# Patient Record
Sex: Female | Born: 1965 | Race: Black or African American | Hispanic: No | Marital: Married | Smoking: Never smoker
Health system: Southern US, Community
[De-identification: ages and names within clinical notes are randomized; demographics above are authoritative.]

## PROBLEM LIST (undated history)

## (undated) DIAGNOSIS — Q211 Atrial septal defect, unspecified: Secondary | ICD-10-CM

## (undated) DIAGNOSIS — R55 Syncope and collapse: Secondary | ICD-10-CM

## (undated) DIAGNOSIS — R2990 Unspecified symptoms and signs involving the nervous system: Secondary | ICD-10-CM

## (undated) DIAGNOSIS — R87619 Unspecified abnormal cytological findings in specimens from cervix uteri: Secondary | ICD-10-CM

## (undated) DIAGNOSIS — Z8742 Personal history of other diseases of the female genital tract: Secondary | ICD-10-CM

## (undated) DIAGNOSIS — R011 Cardiac murmur, unspecified: Secondary | ICD-10-CM

## (undated) HISTORY — DX: Atrial septal defect: Q21.1

## (undated) HISTORY — DX: Atrial septal defect, unspecified: Q21.10

## (undated) HISTORY — DX: Unspecified abnormal cytological findings in specimens from cervix uteri: R87.619

## (undated) HISTORY — PX: CARDIAC CATHETERIZATION: SHX172

## (undated) HISTORY — PX: US ECHOCARDIOGRAPHY: HXRAD669

## (undated) HISTORY — DX: Unspecified symptoms and signs involving the nervous system: R29.90

## (undated) HISTORY — DX: Personal history of other diseases of the female genital tract: Z87.42

## (undated) HISTORY — DX: Cardiac murmur, unspecified: R01.1

## (undated) HISTORY — DX: Syncope and collapse: R55

## (undated) HISTORY — PX: TUBAL LIGATION: SHX77

---

## 2000-10-16 ENCOUNTER — Other Ambulatory Visit: Admission: RE | Admit: 2000-10-16 | Discharge: 2000-10-16 | Payer: Self-pay | Admitting: Internal Medicine

## 2001-10-18 ENCOUNTER — Other Ambulatory Visit: Admission: RE | Admit: 2001-10-18 | Discharge: 2001-10-18 | Payer: Self-pay | Admitting: Internal Medicine

## 2002-10-22 ENCOUNTER — Other Ambulatory Visit: Admission: RE | Admit: 2002-10-22 | Discharge: 2002-10-22 | Payer: Self-pay | Admitting: Internal Medicine

## 2003-11-07 ENCOUNTER — Other Ambulatory Visit: Admission: RE | Admit: 2003-11-07 | Discharge: 2003-11-07 | Payer: Self-pay | Admitting: Internal Medicine

## 2003-12-17 ENCOUNTER — Encounter: Payer: Self-pay | Admitting: Internal Medicine

## 2003-12-23 ENCOUNTER — Encounter: Payer: Self-pay | Admitting: Cardiology

## 2003-12-23 ENCOUNTER — Ambulatory Visit (HOSPITAL_COMMUNITY): Admission: RE | Admit: 2003-12-23 | Discharge: 2003-12-23 | Payer: Self-pay | Admitting: Cardiology

## 2004-02-15 ENCOUNTER — Emergency Department (HOSPITAL_COMMUNITY): Admission: EM | Admit: 2004-02-15 | Discharge: 2004-02-15 | Payer: Self-pay | Admitting: *Deleted

## 2004-04-20 ENCOUNTER — Emergency Department (HOSPITAL_COMMUNITY): Admission: EM | Admit: 2004-04-20 | Discharge: 2004-04-20 | Payer: Self-pay | Admitting: Emergency Medicine

## 2004-08-19 ENCOUNTER — Encounter: Payer: Self-pay | Admitting: Internal Medicine

## 2006-06-05 ENCOUNTER — Ambulatory Visit: Payer: Self-pay | Admitting: Internal Medicine

## 2006-06-05 ENCOUNTER — Other Ambulatory Visit: Admission: RE | Admit: 2006-06-05 | Discharge: 2006-06-05 | Payer: Self-pay | Admitting: Internal Medicine

## 2006-06-05 ENCOUNTER — Encounter: Payer: Self-pay | Admitting: Internal Medicine

## 2006-06-05 LAB — CONVERTED CEMR LAB
ALT: 14 units/L (ref 0–40)
AST: 20 units/L (ref 0–37)
BUN: 12 mg/dL (ref 6–23)
Basophils Absolute: 0 10*3/uL (ref 0.0–0.1)
Basophils Relative: 0.6 % (ref 0.0–1.0)
Bilirubin, Direct: 0.1 mg/dL (ref 0.0–0.3)
CO2: 27 meq/L (ref 19–32)
Calcium: 8.8 mg/dL (ref 8.4–10.5)
Chloride: 105 meq/L (ref 96–112)
Creatinine, Ser: 0.8 mg/dL (ref 0.4–1.2)
Eosinophil percent: 0.8 % (ref 0.0–5.0)
Glucose, Bld: 88 mg/dL (ref 70–99)
HDL: 44.3 mg/dL (ref >39.0)
Lymphocytes Relative: 24.8 % (ref 12.0–46.0)
MCV: 90.3 fL (ref 78.0–100.0)
Monocytes Absolute: 0.5 10*3/uL (ref 0.2–0.7)
Neutro Abs: 4.7 10*3/uL (ref 1.4–7.7)
Neutrophils Relative %: 67.1 % (ref 43.0–77.0)
Sodium: 138 meq/L (ref 135–145)
WBC: 7 10*3/uL (ref 4.5–10.5)

## 2006-10-02 ENCOUNTER — Ambulatory Visit: Payer: Self-pay | Admitting: Internal Medicine

## 2006-10-02 ENCOUNTER — Encounter: Admission: RE | Admit: 2006-10-02 | Discharge: 2006-10-02 | Payer: Self-pay | Admitting: Internal Medicine

## 2007-01-12 ENCOUNTER — Ambulatory Visit: Payer: Self-pay | Admitting: Internal Medicine

## 2007-04-12 ENCOUNTER — Telehealth: Payer: Self-pay | Admitting: Internal Medicine

## 2007-06-26 ENCOUNTER — Other Ambulatory Visit: Admission: RE | Admit: 2007-06-26 | Discharge: 2007-06-26 | Payer: Self-pay | Admitting: Internal Medicine

## 2007-06-26 ENCOUNTER — Ambulatory Visit: Payer: Self-pay | Admitting: Internal Medicine

## 2007-06-26 ENCOUNTER — Encounter: Payer: Self-pay | Admitting: Internal Medicine

## 2007-06-26 DIAGNOSIS — Q211 Atrial septal defect: Secondary | ICD-10-CM | POA: Insufficient documentation

## 2007-07-02 LAB — CONVERTED CEMR LAB
AST: 16 units/L (ref 0–37)
Albumin: 3.6 g/dL (ref 3.5–5.2)
Alkaline Phosphatase: 44 units/L (ref 39–117)
Basophils Relative: 0.7 % (ref 0.0–1.0)
Chloride: 106 meq/L (ref 96–112)
Eosinophils Absolute: 0.1 10*3/uL (ref 0.0–0.6)
GFR calc non Af Amer: 66 mL/min
Glucose, Bld: 75 mg/dL (ref 70–99)
HCT: 36.6 % (ref 36.0–46.0)
LDL Cholesterol: 126 mg/dL — ABNORMAL HIGH (ref 0–99)
Lymphocytes Relative: 27.5 % (ref 12.0–46.0)
MCV: 89.8 fL (ref 78.0–100.0)
Neutro Abs: 4 10*3/uL (ref 1.4–7.7)
Platelets: 203 10*3/uL (ref 150–400)
Potassium: 4.3 meq/L (ref 3.5–5.1)
RDW: 13.3 % (ref 11.5–14.6)
Total Protein: 7 g/dL (ref 6.0–8.3)
VLDL: 9 mg/dL (ref 0–40)

## 2007-07-06 ENCOUNTER — Ambulatory Visit: Payer: Self-pay

## 2007-07-06 ENCOUNTER — Encounter: Payer: Self-pay | Admitting: Internal Medicine

## 2007-07-12 ENCOUNTER — Telehealth: Payer: Self-pay | Admitting: Internal Medicine

## 2007-09-03 ENCOUNTER — Telehealth: Payer: Self-pay | Admitting: Internal Medicine

## 2007-09-20 ENCOUNTER — Encounter: Payer: Self-pay | Admitting: Internal Medicine

## 2007-09-25 ENCOUNTER — Encounter: Payer: Self-pay | Admitting: Internal Medicine

## 2007-09-26 ENCOUNTER — Encounter: Payer: Self-pay | Admitting: Internal Medicine

## 2007-10-03 ENCOUNTER — Ambulatory Visit: Payer: Self-pay | Admitting: Internal Medicine

## 2007-10-03 DIAGNOSIS — R05 Cough: Secondary | ICD-10-CM

## 2007-10-03 DIAGNOSIS — R059 Cough, unspecified: Secondary | ICD-10-CM | POA: Insufficient documentation

## 2007-10-04 ENCOUNTER — Encounter: Payer: Self-pay | Admitting: Internal Medicine

## 2007-12-31 ENCOUNTER — Ambulatory Visit: Payer: Self-pay | Admitting: Internal Medicine

## 2007-12-31 DIAGNOSIS — T7840XA Allergy, unspecified, initial encounter: Secondary | ICD-10-CM | POA: Insufficient documentation

## 2008-01-27 ENCOUNTER — Encounter: Payer: Self-pay | Admitting: Internal Medicine

## 2008-01-28 ENCOUNTER — Telehealth: Payer: Self-pay | Admitting: Internal Medicine

## 2008-01-28 ENCOUNTER — Ambulatory Visit: Payer: Self-pay | Admitting: Internal Medicine

## 2008-01-28 DIAGNOSIS — R079 Chest pain, unspecified: Secondary | ICD-10-CM | POA: Insufficient documentation

## 2008-01-28 DIAGNOSIS — R5383 Other fatigue: Secondary | ICD-10-CM | POA: Insufficient documentation

## 2008-01-28 DIAGNOSIS — R5381 Other malaise: Secondary | ICD-10-CM | POA: Insufficient documentation

## 2008-01-30 ENCOUNTER — Encounter: Payer: Self-pay | Admitting: Internal Medicine

## 2008-02-05 ENCOUNTER — Encounter: Payer: Self-pay | Admitting: Internal Medicine

## 2008-07-01 ENCOUNTER — Ambulatory Visit: Payer: Self-pay | Admitting: Internal Medicine

## 2008-07-01 ENCOUNTER — Other Ambulatory Visit: Admission: RE | Admit: 2008-07-01 | Discharge: 2008-07-01 | Payer: Self-pay | Admitting: Internal Medicine

## 2008-07-01 ENCOUNTER — Encounter: Payer: Self-pay | Admitting: Internal Medicine

## 2008-07-01 DIAGNOSIS — M949 Disorder of cartilage, unspecified: Secondary | ICD-10-CM

## 2008-07-01 DIAGNOSIS — M899 Disorder of bone, unspecified: Secondary | ICD-10-CM | POA: Insufficient documentation

## 2008-07-01 LAB — CONVERTED CEMR LAB
Bilirubin Urine: NEGATIVE
Nitrite: POSITIVE
Vit D, 1,25-Dihydroxy: 23 — ABNORMAL LOW (ref 30–89)
WBC Urine, dipstick: NEGATIVE
pH: 5

## 2008-07-03 LAB — CONVERTED CEMR LAB
ALT: 15 units/L (ref 0–35)
Albumin: 4.1 g/dL (ref 3.5–5.2)
Basophils Absolute: 0 10*3/uL (ref 0.0–0.1)
Bilirubin, Direct: 0.3 mg/dL (ref 0.0–0.3)
Chloride: 105 meq/L (ref 96–112)
Cholesterol: 184 mg/dL (ref 0–200)
Creatinine, Ser: 0.7 mg/dL (ref 0.4–1.2)
Eosinophils Relative: 1 % (ref 0.0–5.0)
Glucose, Bld: 76 mg/dL (ref 70–99)
HCT: 40.6 % (ref 36.0–46.0)
HDL: 52.6 mg/dL (ref >39.0)
Hemoglobin: 13.9 g/dL (ref 12.0–15.0)
Lymphocytes Relative: 24 % (ref 12.0–46.0)
Platelets: 174 10*3/uL (ref 150–400)
RBC: 4.39 M/uL (ref 3.87–5.11)
RDW: 13.2 % (ref 11.5–14.6)
Sodium: 138 meq/L (ref 135–145)
TSH: 1.84 microintl units/mL (ref 0.35–5.50)
Total Bilirubin: 1.3 mg/dL — ABNORMAL HIGH (ref 0.3–1.2)
WBC: 7.2 10*3/uL (ref 4.5–10.5)

## 2008-08-01 DIAGNOSIS — I639 Cerebral infarction, unspecified: Secondary | ICD-10-CM

## 2008-08-01 HISTORY — DX: Cerebral infarction, unspecified: I63.9

## 2008-08-14 ENCOUNTER — Encounter: Payer: Self-pay | Admitting: Internal Medicine

## 2008-11-10 ENCOUNTER — Ambulatory Visit: Payer: Self-pay | Admitting: Internal Medicine

## 2008-11-11 ENCOUNTER — Ambulatory Visit: Payer: Self-pay | Admitting: Vascular Surgery

## 2008-11-11 ENCOUNTER — Encounter: Payer: Self-pay | Admitting: Internal Medicine

## 2008-11-11 ENCOUNTER — Observation Stay (HOSPITAL_COMMUNITY): Admission: EM | Admit: 2008-11-11 | Discharge: 2008-11-11 | Payer: Self-pay | Admitting: Emergency Medicine

## 2008-11-12 ENCOUNTER — Ambulatory Visit: Payer: Self-pay | Admitting: Internal Medicine

## 2009-01-22 ENCOUNTER — Encounter: Payer: Self-pay | Admitting: Internal Medicine

## 2009-03-04 ENCOUNTER — Telehealth: Payer: Self-pay | Admitting: *Deleted

## 2009-07-07 ENCOUNTER — Encounter (INDEPENDENT_AMBULATORY_CARE_PROVIDER_SITE_OTHER): Payer: Self-pay | Admitting: *Deleted

## 2009-07-09 ENCOUNTER — Telehealth: Payer: Self-pay | Admitting: *Deleted

## 2009-07-15 ENCOUNTER — Other Ambulatory Visit: Admission: RE | Admit: 2009-07-15 | Discharge: 2009-07-15 | Payer: Self-pay | Admitting: Internal Medicine

## 2009-07-15 ENCOUNTER — Ambulatory Visit: Payer: Self-pay | Admitting: Internal Medicine

## 2010-03-12 ENCOUNTER — Telehealth: Payer: Self-pay | Admitting: *Deleted

## 2010-05-26 ENCOUNTER — Telehealth: Payer: Self-pay | Admitting: *Deleted

## 2010-05-27 ENCOUNTER — Encounter: Payer: Self-pay | Admitting: Internal Medicine

## 2010-06-28 ENCOUNTER — Ambulatory Visit: Payer: Self-pay

## 2010-07-02 ENCOUNTER — Encounter: Payer: Self-pay | Admitting: Internal Medicine

## 2010-07-08 ENCOUNTER — Encounter: Payer: Self-pay | Admitting: Internal Medicine

## 2010-07-08 ENCOUNTER — Ambulatory Visit: Payer: Self-pay

## 2010-07-20 ENCOUNTER — Telehealth: Payer: Self-pay | Admitting: Internal Medicine

## 2010-07-23 ENCOUNTER — Other Ambulatory Visit
Admission: RE | Admit: 2010-07-23 | Discharge: 2010-07-23 | Payer: Self-pay | Source: Home / Self Care | Admitting: Family Medicine

## 2010-07-28 ENCOUNTER — Ambulatory Visit: Payer: Self-pay | Admitting: Internal Medicine

## 2010-07-28 ENCOUNTER — Telehealth: Payer: Self-pay

## 2010-07-29 ENCOUNTER — Telehealth: Payer: Self-pay

## 2010-08-29 LAB — CONVERTED CEMR LAB
Bilirubin Urine: NEGATIVE
Glucose, Urine, Semiquant: NEGATIVE
Protein, U semiquant: NEGATIVE

## 2010-08-31 NOTE — Progress Notes (Signed)
Summary: wants order faxed  Phone Note Call from Patient Call back at Work Phone [phone]   Caller: Patient---live call Summary of Call: need an order for a screening mammogram sent to [facility]. she has not made her appt yet. wants to go before her December cpx. Initial call taken by: Warnell Forester,  May 26, 2010 9:22 AM  Follow-up for Phone Call        Left message on machine saying that pt doesn't need a order for a screening mmg. I told her to call [facility] and just schedule the appt. Then if they tell her that she needs a order, we would be happy to do one. Just call us back to let us know. Follow-up by: Romualdo Bolk, CMA Duncan Dull),  May 26, 2010 9:29 AM

## 2010-08-31 NOTE — Progress Notes (Signed)
Summary: Pt sch cpx for Dec 2011. Req to get labs done at [facility]  Phone Note Call from Patient Call back at [phone] cell   Caller: Patient Summary of Call: Pt called and has sch her cpx for Dec. 2011. Pt is req to get her labs done at [facility] again this year. Pls do order for cpx labs, and pt will pick up when ready.  Initial call taken by: Lucy Antigua,  March 12, 2010 9:15 AM  Follow-up for Phone Call        ok to do this Follow-up by: Madelin Headings MD,  March 15, 2010 9:57 PM  Additional Follow-up for Phone Call Additional follow up Details #1::        Rx is ready to pick up. Pt aware. Additional Follow-up by: Romualdo Bolk, CMA (AAMA),  March 17, 2010 12:12 PM

## 2010-09-02 NOTE — Progress Notes (Signed)
----   Converted from flag ---- ---- 07/29/2010 7:45 AM, Edwyna Perfect MD wrote: pap neg/nl ------------------------------  pt aware

## 2010-09-02 NOTE — Progress Notes (Signed)
----   Converted from flag ---- ---- 07/28/2010 9:42 AM, Edwyna Perfect MD wrote: pls notify physical labs nl ------------------------------  pt aware

## 2010-09-02 NOTE — Progress Notes (Signed)
Summary: REQUEST FOR APPT / RETURN CALL?  Phone Note Call from Patient   Caller: Patient  (In office) Summary of Call: Pt was on bump list, had appt scheduled today (12/20) and took today off work for that appt..... didn't know that she wasn't going to be able to be seen today.Marland KitchenMarland KitchenMarland KitchenPt adv that she had spoke with Carollee Herter, CMA and thought she was going to call back to advise but adv no one called.... would like to be worked into schedule for cpx by the end of year...   Can you advise?  Pt can be reached at work # [phone] to advise.  Pt may c/b to advise when she will be able to take another day off work to come in for cpx but would like a return call regardless.   Initial call taken by: Debbra Riding,  July 20, 2010 9:45 AM  Follow-up for Phone Call        Pt called back and stated that she had to work something out with her schedule due to people at work taking time off.... Went through schedule as she e-mailed her supervisor to find a day that worked and pt adv she would have to have cpx done this Friday, 12/23 (only time pt could take time off before end of year, and/or only time she could come in late for appt).... Pt was scheduled with Dr Rodena Medin for Friday, 07/23/10  at  8:15am.... Pt adv she will switch to Dr Rodena Medin if necessary to keep this appt - has to have cpx before end of year.  Follow-up by: Debbra Riding,  July 20, 2010 10:47 AM  Additional Follow-up for Phone Call Additional follow up Details #1::        Pt got bumped because of EMR  training.Please make That she is  aware that I had nothing to do with the change and this was made by administration.  I would work her in any time im here but will not be here   December   23rd . BUt I will be here next week.  I f  ok with Dr Rodena Medin and he  can see her it is ok with me. Additional Follow-up by: Madelin Headings MD,  July 20, 2010 5:33 PM    Additional Follow-up for Phone Call Additional follow up Details #2::     ok Follow-up by: Edwyna Perfect MD,  July 21, 2010 8:06 AM

## 2010-09-02 NOTE — Assessment & Plan Note (Signed)
Summary: CPX // RS   Vital Signs:  Patient profile:   45 year old female Menstrual status:  regular Height:      64.5 inches Weight:      134.5 pounds BMI:     22.81 Temp:     98.2 degrees F Pulse rate:   68 / minute BP sitting:   110 / 60  (right arm) CC: CPX   CC:  CPX.  History of Present Illness: Pt presents to clinic for annual physical. Has h/o ASD without repair previously followed by Cardiology. Not interested in further cardiology or echocardiogram surveillance and she indicates she will continue to decline surgical intervention. States is asx without dyspnea, chest pain, palpitations or other cardiac symptoms.  Declines influenza vaccine. No active complaints.  Preventive Screening-Counseling & Management  Alcohol-Tobacco     Smoking Status: never     Smoking Cessation Counseling: no     Tobacco Counseling: not indicated; no tobacco use  Problems Prior to Update: 1)  Hx of Weakness, Left Side of Bodyresolved  (ICD-342.90) 2)  Disorder of Bone and Cartilage Unspecified  (ICD-733.90) 3)  Fatigue  (ICD-780.79) 4)  Chest Pain  (ICD-786.50) 5)  Allergic Reaction  (ICD-995.3) 6)  Cough  (ICD-786.2) 7)  Routine Gynecological Examination  (ICD-V72.31) 8)  Atrial Septal Defect  (ICD-745.5) 9)  Preventive Health Care  (ICD-V70.0) 10)  Family History Breast Cancer 1st Degree Relative <50  (ICD-V16.3)  Allergies: No Known Drug Allergies  Family History: Reviewed history from 11/12/2008 and no changes required. Family History Breast cancer 1st degree relative <50 mom  Family History of Cardiovascular disorder bro died mva age 36 Step sister " Hole in heart "since young no one at home with a rash      Social History: Reviewed history from 07/15/2009 and no changes required. Occupation: works at [employer]   40 hours week Married  husband currently unemployed  Never Smoked Alcohol use-no Drug use-no Regular exercise-no   hh of [address].  No pets    church on  saturday doesnt celebrate holidays  Review of Systems General:  Denies chills, fatigue, fever, and sweats; +hot flashes. Eyes:  Denies blurring and vision loss-both eyes. CV:  Denies chest pain or discomfort, difficulty breathing at night, difficulty breathing while lying down, fainting, fatigue, lightheadness, near fainting, palpitations, shortness of breath with exertion, and swelling of feet. Resp:  Denies chest discomfort, chest pain with inspiration, cough, coughing up blood, shortness of breath, and sputum productive. GI:  Denies abdominal pain, bloody stools, change in bowel habits, nausea, and vomiting. Derm:  Denies changes in color of skin, lesion(s), and rash. Neuro:  Denies headaches, inability to speak, memory loss, tingling, visual disturbances, and weakness. Heme:  Denies abnormal bruising and fevers.  Physical Exam  General:  Well-developed,well-nourished,in no acute distress; alert,appropriate and cooperative throughout examination Head:  Normocephalic and atraumatic without obvious abnormalities. No apparent alopecia or balding. Eyes:  pupils equal, pupils round, pupils reactive to light, corneas and lenses clear, and no injection.   Ears:  External ear exam shows no significant lesions or deformities.  Otoscopic examination reveals clear canals, tympanic membranes are intact bilaterally without bulging, retraction, inflammation or discharge. Hearing is grossly normal bilaterally. Nose:  External nasal examination shows no deformity or inflammation. Nasal mucosa are pink and moist without lesions or exudates. Mouth:  Oral mucosa and oropharynx without lesions or exudates.  Teeth in good repair. Neck:  No deformities, masses, or tenderness noted. Breasts:  No mass, nodules, thickening, tenderness, bulging, retraction, inflamation, nipple discharge or skin changes noted.  With female nurse escort exam performed Lungs:  Normal respiratory effort, chest expands symmetrically.  Lungs are clear to auscultation, no crackles or wheezes. Heart:  normal rate, regular rhythm, no gallop, no rub, no JVD, and Grade   3-4/6 systolic ejection murmur.   Abdomen:  Bowel sounds positive,abdomen soft and non-tender without masses, organomegaly or hernias noted. Genitalia:  Pelvic Exam:        External: normal female genitalia without lesions or masses        Vagina: normal without lesions or masses        Cervix: normal without lesions or masses        Adnexa: normal bimanual exam without masses or fullness        Uterus: normal by palpation        Pap smear: performed Exam performed with female nurse escort Pulses:  R and L carotid,radial,femoral,dorsalis pedis and posterior tibial pulses are full and equal bilaterally Extremities:  No clubbing, cyanosis, edema, or deformity noted with normal full range of motion of all joints.     Impression & Recommendations:  Problem # 1:  Preventive Health Care (ICD-V70.0) Assessment Unchanged Followup screening labs recently obtained. Recommend yearly mammography. Notify pap results. Declines influenza vaccine. Followup 1 year cpe or sooner if needed.  Problem # 2:  ATRIAL SEPTAL DEFECT (ICD-745.5) Assessment: Unchanged Asx. Declines further evaluation.  Patient Instructions: 1)  Please schedule a follow-up appointment in 1 year for your physical.   Orders Added: 1)  Est. Patient 40-64 years [phone]

## 2010-11-10 LAB — CK TOTAL AND CKMB (NOT AT ARMC)
CK, MB: 0.3 ng/mL (ref 0.3–4.0)
Relative Index: INVALID (ref 0.0–2.5)

## 2010-11-10 LAB — POCT CARDIAC MARKERS
CKMB, poc: <1 ng/mL — ABNORMAL LOW (ref 1.0–8.0)
Troponin i, poc: <0.05 ng/mL (ref 0.00–0.09)

## 2010-11-10 LAB — LIPID PANEL
HDL: 40 mg/dL (ref >39)
LDL Cholesterol: 118 mg/dL — ABNORMAL HIGH (ref 0–99)
Total CHOL/HDL Ratio: 4.2 RATIO
Triglycerides: 56 mg/dL (ref <150)
VLDL: 11 mg/dL (ref 0–40)

## 2010-11-10 LAB — COMPREHENSIVE METABOLIC PANEL
ALT: 12 U/L (ref 0–35)
AST: 21 U/L (ref 0–37)
Albumin: 3.4 g/dL — ABNORMAL LOW (ref 3.5–5.2)
Albumin: 3.5 g/dL (ref 3.5–5.2)
Alkaline Phosphatase: 44 U/L (ref 39–117)
BUN: 7 mg/dL (ref 6–23)
Calcium: 8.5 mg/dL (ref 8.4–10.5)
Chloride: 108 mEq/L (ref 96–112)
Chloride: 108 mEq/L (ref 96–112)
Creatinine, Ser: 0.8 mg/dL (ref 0.4–1.2)
GFR calc Af Amer: >60 mL/min (ref >60)
Glucose, Bld: 84 mg/dL (ref 70–99)
Potassium: 3.7 mEq/L (ref 3.5–5.1)
Sodium: 138 mEq/L (ref 135–145)
Total Bilirubin: 1 mg/dL (ref 0.3–1.2)
Total Protein: 7.3 g/dL (ref 6.0–8.3)
Total Protein: 7.3 g/dL (ref 6.0–8.3)

## 2010-11-10 LAB — PREGNANCY, URINE: Preg Test, Ur: NEGATIVE

## 2010-11-10 LAB — URINALYSIS, ROUTINE W REFLEX MICROSCOPIC
Bilirubin Urine: NEGATIVE
Hgb urine dipstick: NEGATIVE
Nitrite: NEGATIVE
Protein, ur: NEGATIVE mg/dL
Specific Gravity, Urine: 1.014 (ref 1.005–1.030)
pH: 5.5 (ref 5.0–8.0)

## 2010-11-10 LAB — CBC
HCT: 36.6 % (ref 36.0–46.0)
Platelets: 169 10*3/uL (ref 150–400)
WBC: 7.5 10*3/uL (ref 4.0–10.5)

## 2010-11-10 LAB — TROPONIN I: Troponin I: <0.01 ng/mL (ref 0.00–0.06)

## 2010-12-14 NOTE — H&P (Signed)
NAMEMEGHEN, AKOPYAN      ACCOUNT NO.:  [id number]   MEDICAL RECORD NO.:  [id number]          PATIENT TYPE:  INP   LOCATION:  3731                         FACILITY:  [facility]   PHYSICIAN:  Michiel Cowboy, MDDATE OF BIRTH:  1966-06-15   DATE OF ADMISSION:  11/10/2008  DATE OF DISCHARGE:                              HISTORY & PHYSICAL   PRIMARY CARE Khyler Eschmann:  Neta Mends. Panosh, M.D. with [facility].   CHIEF COMPLAINT:  Nausea, left-sided weakness and possible chest pain.   HISTORY OF THE PRESENT ILLNESS:  The patient is a 45 year old female who  has a history of atrioseptal defect followed by Dr. Anner Crete with  cardiology and who presents with possible chest pain and left-sided  weakness.  The patient came home at around 10 P.M. from work and was  trying to fix herself dinner when she noticed she was having some left-  sided arm and leg weakness.  She also may have had at the same time the  sensation of presyncope, lightheadedness and diaphoresis, and she thinks  maybe chest pain as well, although later she denied this.  These  symptoms lasted for about an hour and she called EMS, and she was  brought into the emergency department where a CT scan of her head was  negative.  The patient was given aspirin and Zofran, and [facility] was  called for admission.  At this time all her symptoms had completely  resolved.   PAST MEDICAL HISTORY:  The past medical history is significant for  atrioseptal defect, otherwise negative.   SOCIAL HISTORY:  The patient denies smoking.  She does not drink or use  drugs.  She lives with her spouse.   FAMILY HISTORY:  The family history is noncontributory.   ALLERGIES:  No known drug allergies.   MEDICATIONS:  The patient takes no medications.   PHYSICAL EXAMINATION:  VITAL SIGNS:  Temperature 98.0, blood pressure  121/90, pulse 87, respirations 18, and she is satting at 100% on room  air.  GENERAL APPEARANCE:  The patient appears to be in no  acute distress.  HEENT:  Head is atraumatic. Moist mucous membranes.  LUNGS:  The lungs are clear to auscultation bilaterally.  HEART:  The heart has a regular rate and rhythm with no murmurs  appreciated.  ABDOMEN:  The abdomen is soft, nontender and nondistended, and slightly  obese.  EXTREMITIES:  The lower extremities are without clubbing, cyanosis or  edema.  NEUROLOGIC EXAMINATION:  Neurologically her strength is 5/5 in all four  extremities.  Cranial nerves II-XII are intact. Neurologically she  appears to be intact.  SKIN:  There are some excoriations on the back, but otherwise intact.   LABORATORY DATA:  Sodium 138, potassium 3.7 and creatinine 0.8.  Otherwise LFTs were within normal limits.  Cardiac markers were  negative.  Negative UA.  Negative pregnancy test.  Computerized  tomographic of the head was unremarkable.  EKG showed normal sinus  rhythm, heart rate of 80 with first degree A-V block, and a PR interval  of 224.   ASSESSMENT AND PLAN:  This is a 45 year old female with a past history  of atrioseptal defect who presents with transient neurological symptoms  and possible chest pain.  1. Neurological symptoms.  Transient ischemic attack cannot be      excluded; however, especially with an atrioseptal defect put her at      a slightly high risk for coronary symptoms, currently resolved.  We      will obtain an magnetic resonance imaging and magnetic resonance      angiography with a two-dimensional echocardiogram, and carotid      Dopplers.  We will further risk stratify her with a fasting lipid      profile, hemoglobin A-1-C and homocystine level.  2. Chest pain.  The patient is unsure if she had this, but we will      cycle cardiac markers and obtain serial electrocardiograms.  3. Blood pressure per the patient; this is stable for her as this is      what her usual blood pressure runs.  We will monitor.  Given her      presyncope we will check orthostatics and  give her gentle      intravenous fluids.  4. Prophylaxes.  Protonix plus Lovenox.      Michiel Cowboy, MD  Electronically Signed     AVD/MEDQ  D:  11/11/2008  T:  11/11/2008  Job:  [id number]   cc:   Neta Mends. Fabian Sharp, MD

## 2010-12-14 NOTE — Discharge Summary (Signed)
Beth Romero, Beth Romero      ACCOUNT NO.:  [id number]   MEDICAL RECORD NO.:  [id number]          PATIENT TYPE:  INP   LOCATION:  3731                         FACILITY:  [facility]   PHYSICIAN:  Beth Romero, MDDATE OF BIRTH:  Jun 08, 1966   DATE OF ADMISSION:  11/10/2008  DATE OF DISCHARGE:  11/11/2008                               DISCHARGE SUMMARY   DISCHARGE DIAGNOSES:  1. Transient left-sided weakness, questions transient ischemic attack,      negative neurology workup this hospitalization.  See details below.  2. Transient chest pain.  No evidence of acute cardiac abnormality.  3. Mild asymptomatic hypotension, suspect baseline.  Discharge blood      pressure 101/72, asymptomatic.  4. History of atrial septal defect, outpatient followup with Dr. Anner Romero      with Cardiology.   Discharge medications recommended are aspirin 81 mg every other day.  The patient was not on any other prescription or over-the-counter or  herbal medications prior to admission.   DISPOSITION:  The patient is discharged home in a medically stable and  improved condition.  She is asymptomatic and has no further complaints  of anything at this time.  Her family is present at the bedside at the  time of discharge.  Followup will be with her primary care physician,  Dr. Berniece Romero.  The patient instructed to call in the next 2 weeks  for followup on echocardiogram ultrasound results as well as general  overall health status.   HOSPITAL COURSE BY PROBLEM:  Transient left-sided weakness.  The patient  is a 45 year old woman in overall good health with history of atrial  septal defect of her heart, which has been asymptomatic and came to the  emergency room on the evening of admission complaining of some left-  sided arm and leg weakness while she was trying to fix herself dinner  after work.  She denied any other associated symptoms and reports that  she was merely feeling fatigued and tired, but  because of concerns of  symptoms lasting for approximately 1 hour, she called EMS and was  brought to the emergency room for further Neuro evaluation.  Initial  head CT was negative, but she was admitted for evaluation of possible  TIA given the resolution of her symptoms by the time she arrived to the  emergency room.  She was placed on telemetry.  She underwent an MRI of  her brain, which showed no acute abnormalities including no  atherosclerotic disease of the large and moderate vessels within her  brain.  Bilateral carotid Dopplers were performed that showed no  significant ICA stenosis and a 2D echo of her heart was performed, the  results are pending at time of this dictation.  The patient has been  asymptomatic throughout this hospitalization.  No arrhythmias noted.  Cardiac enzymes negative x2 and no neuro deficits recurring.  I have  discussed with the patient the results of her finding and recommend the  patient for antiplatelet therapy to continue after discharge, which she  seemed somewhat disincline to do, but said she will consider.  She  reports again that she believes her symptoms  were due to fatigue and  plans to adjust her personal schedule and obligations to reduce the  amount of fatigue causing events in her life.  So, the patient again is  evasive on the details of these plans.  Her family is present at bedside  and all are in agreement with the patient's discharge home.  I have  encouraged her to follow up with her primary care physician, Dr. Berniece Romero in the next 2 weeks or sooner for results of her ultrasound to be  reviewed as well as follow up on her overall medical health status.  Fasting lipids were checked during this hospitalization and  within normal limits.  LDL slightly high at 118, but not requiring  further treatment.  Hemoglobin A1c is 5.7.  Normal CBC, Coags, CMET, and  stable hemodynamics including normal O2 sats at 99% on room air.  The  patient  is felt stable for discharge home at this time.      Beth A. Felicity Coyer, MD  Electronically Signed     VAL/MEDQ  D:  11/11/2008  T:  11/12/2008  Job:  [id number]

## 2011-05-10 ENCOUNTER — Telehealth: Payer: Self-pay | Admitting: Internal Medicine

## 2011-05-10 NOTE — Telephone Encounter (Signed)
Pt needs cpx lab order mail to her. Pt is sch for cpx 12-5.

## 2011-05-11 NOTE — Telephone Encounter (Signed)
Rx mailed to pt

## 2011-07-05 ENCOUNTER — Encounter: Payer: Self-pay | Admitting: Internal Medicine

## 2011-07-06 ENCOUNTER — Ambulatory Visit (INDEPENDENT_AMBULATORY_CARE_PROVIDER_SITE_OTHER): Payer: 59 | Admitting: Internal Medicine

## 2011-07-06 ENCOUNTER — Encounter: Payer: Self-pay | Admitting: Internal Medicine

## 2011-07-06 VITALS — BP 100/60 | HR 72 | Ht 64.5 in | Wt 127.0 lb

## 2011-07-06 DIAGNOSIS — Z0000 Encounter for general adult medical examination without abnormal findings: Secondary | ICD-10-CM

## 2011-07-06 DIAGNOSIS — N951 Menopausal and female climacteric states: Secondary | ICD-10-CM

## 2011-07-06 DIAGNOSIS — R8290 Unspecified abnormal findings in urine: Secondary | ICD-10-CM | POA: Insufficient documentation

## 2011-07-06 DIAGNOSIS — R82998 Other abnormal findings in urine: Secondary | ICD-10-CM

## 2011-07-06 DIAGNOSIS — Q211 Atrial septal defect: Secondary | ICD-10-CM

## 2011-07-06 LAB — POCT URINALYSIS DIPSTICK
Protein, UA: NEGATIVE
Spec Grav, UA: 1.025
Urobilinogen, UA: 1
pH, UA: 5.5

## 2011-07-06 LAB — HEPATIC FUNCTION PANEL
Alkaline Phosphatase: 64 U/L (ref 39–117)
Bilirubin, Direct: 0.1 mg/dL (ref 0.0–0.3)
Total Bilirubin: 0.6 mg/dL (ref 0.3–1.2)

## 2011-07-06 NOTE — Patient Instructions (Addendum)
Your symptom sound like menopause but I am worried about your loss of appetite. Liver tests an calcium is slightly off  Need to be repeated  Will get repeat urine and culture test.  today  treated depending on results . Then plan labd  Calcium pth , lfts vit d level.  Track your periods

## 2011-07-07 LAB — PTH, INTACT AND CALCIUM: PTH: 138.9 pg/mL — ABNORMAL HIGH (ref 14.0–72.0)

## 2011-07-08 LAB — URINE CULTURE: Colony Count: 90000

## 2011-07-09 ENCOUNTER — Encounter: Payer: Self-pay | Admitting: Internal Medicine

## 2011-07-09 DIAGNOSIS — Z0000 Encounter for general adult medical examination without abnormal findings: Secondary | ICD-10-CM | POA: Insufficient documentation

## 2011-07-09 DIAGNOSIS — Z8742 Personal history of other diseases of the female genital tract: Secondary | ICD-10-CM | POA: Insufficient documentation

## 2011-07-09 DIAGNOSIS — Q211 Atrial septal defect: Secondary | ICD-10-CM | POA: Insufficient documentation

## 2011-07-09 DIAGNOSIS — R87619 Unspecified abnormal cytological findings in specimens from cervix uteri: Secondary | ICD-10-CM | POA: Insufficient documentation

## 2011-07-09 DIAGNOSIS — N951 Menopausal and female climacteric states: Secondary | ICD-10-CM | POA: Insufficient documentation

## 2011-07-09 NOTE — Progress Notes (Signed)
Subjective:    Patient ID: Beth Romero, female    DOB: 1966/02/16, 45 y.o.   MRN: [id number]  HPI Patient comes in today for preventive visit and follow-up of medical issues. Update of her history since her last visit. No major injuries, ed visits ,hospitalizations , new medications since last visit. However she has had anorexia hot flushes with skipping periods up to 3 months at a time and thenc omes on with lighter flow night sleep effected and thinks" drinking more water makes her sweat more." Had lost some weight and now getting better.  No meds or supplements. No v or d rash fevers. Denies cp sob or exercise change but  tired.  At baseline no syncope shakes or neuro sx.  Dosnt drink milk     Review of Systems ROS:  GEN/ HEENTNo fever, s headaches vision problems hearing changes, CV/ PULM; No chest pain shortness of breath cough, syncope,edema  change in exercise tolerance. GI /GU: No adominal pain, vomiting, change in bowel habits. No blood in the stool. No significant GU symptoms. SKIN/HEME: ,no acute skin rashes suspicious lesions or bleeding. No lymphadenopathy, nodules, masses.  NEURO/ PSYCH:  No neurologic signs such as weakness numbness No depression anxiety. IMM/ Allergy: No unusual infections.  Allergy .   REST of 12 system review negative  Past Medical History  Diagnosis Date  . Abnormal Pap smear of cervix     rx cryo remote  has been normal  . Migraine   . Atrial septal defect     large echo 2008 and 2006  declines any surgical correction for belief reasons consutlts with [facility] . pt aware of risk of irreverible Pulm ht causind devility and death is possible without correction. declines surgery and is not a candidate for other procedures.   . Murmur   . Syncopal episodes     History   Social History  . Marital Status: Married    Spouse Name: N/A    Number of Children: N/A  . Years of Education: N/A   Occupational History  . Not on  file.   Social History Main Topics  . Smoking status: Never Smoker   . Smokeless tobacco: Not on file  . Alcohol Use: No  . Drug Use: No  . Sexually Active: Not on file   Other Topics Concern  . Not on file   Social History Narrative   Occupation: works at [employer] 40 hours weekMarriedHusband currently [employer] exercise- noHH of 7 on the [employer] on sat doesn't celebrate holidays    Past Surgical History  Procedure Date  . US echocardiography     2008 and 2006  . Cardiac catheterization     Summer 2009 [facility] large ASD 2:1 shunt  . Hosp 11/11/11    [facility] weakness r/o TIA neg eval  . Tubal ligation     Family History  Problem Relation Age of Onset  . Breast cancer Mother   . Heart disease    . Other      step sister "hole in the heart"    No Known Allergies  No current outpatient prescriptions on file prior to visit.    BP 100/60  Pulse 72  Ht 5' 4.5" (1.638 m)  Wt 127 lb (57.607 kg)  BMI 21.46 kg/m2  LMP 06/23/2011        Objective:   Physical Exam Physical Exam: Vital signs reviewed NWG:NFAO is a well-developed well-nourished alert cooperative  aa female who appears her  stated age in no acute distress.  HEENT: normocephalic  atraumatic , Eyes: PERRL EOM's full, conjunctiva clear, Nares: paten,t no deformity discharge or tenderness., Ears: no deformity EAC's clear TMs with normal landmarks. Mouth: clear OP, no lesions, edema.  Moist mucous membranes. Dentition in adequate repair. NECK: supple without masses, thyromegaly or bruits. CHEST/PULM:  Clear to auscultation and percussion breath sounds equal no wheeze , rales or rhonchi. No chest wall deformities or tenderness. CV: PMI is palpable no thrills  3/6 prolonged sem ? s2 wide split  No g heard . Peripheral pulses are full without delay.No JVD .  Breast: normal by inspection . No dimpling, discharge, masses, tenderness or discharge .lumpy  ABDOMEN: Bowel sounds normal nontender  No guard  or rebound, no hepato splenomegal no CVA tenderness.  No hernia. Extremtities:  No clubbing cyanosis or edema, no acute joint swelling or redness no focal atrophy NEURO:  Oriented x3, cranial nerves 3-12 appear to be intact, no obvious focal weakness,gait within normal limits no abnormal reflexes or asymmetrical SKIN: No acute rashes normal turgor, color, no bruising or petechiae. PSYCH: Oriented, good eye contact, no obvious depression anxiety, cognition  appear normal.   Lab from [facility] abnormal ua nitrites and 6-10 wbcs with bacteria  Bmp nl except ca 8.4   Alb 4.0 lafts nl x alt 53 LIPDS good ldl 84 hdl 60 tsh 2.6 cbc nl  Hg 11.9       Assessment & Plan:  Preventive Health Care Counseled regarding healthy nutrition, exercise, sleep, injury prevention, calcium vit d and healthy weight . PAP nl last year and for a while disc option to  Do next year or when needed.  Consider not enough vit d  Declines some preventive measures for personal reasons  ASD large and significant  Declining surgical intervention   No obv pulm ht sx.   Perimenopausal sx   Reported  Do not think from other causes  May be early but typical of family. Denies risk of pregnancy.  Weight loss and now better .   Low calcium check vit d levels pth etc  Elevated alt ? Cause  Nl exam will reheck  Abn ua to repeat and check fo r infection

## 2011-07-11 ENCOUNTER — Other Ambulatory Visit: Payer: Self-pay | Admitting: Internal Medicine

## 2011-07-11 MED ORDER — VITAMIN D (ERGOCALCIFEROL) 1.25 MG (50000 UNIT) PO CAPS: 50000.0000 [IU] | ORAL_CAPSULE | ORAL | Status: DC

## 2011-07-11 NOTE — Progress Notes (Signed)
Quick Note:  Left message to call back. ______ 

## 2011-07-11 NOTE — Progress Notes (Signed)
Quick Note:  Pt aware of results. ______ 

## 2011-07-13 ENCOUNTER — Encounter: Payer: Self-pay | Admitting: Internal Medicine

## 2011-08-24 ENCOUNTER — Ambulatory Visit (INDEPENDENT_AMBULATORY_CARE_PROVIDER_SITE_OTHER): Payer: 59 | Admitting: Family

## 2011-08-24 ENCOUNTER — Encounter: Payer: Self-pay | Admitting: Family

## 2011-08-24 VITALS — BP 98/72 | Temp 97.8°F | Wt 128.0 lb

## 2011-08-24 DIAGNOSIS — J069 Acute upper respiratory infection, unspecified: Secondary | ICD-10-CM

## 2011-08-24 MED ORDER — FLUTICASONE PROPIONATE 50 MCG/ACT NA SUSP: 2.0000 | Freq: Every day | NASAL | Status: DC

## 2011-08-24 MED ORDER — FEXOFENADINE-PSEUDOEPHED ER 180-240 MG PO TB24: 1.0000 | ORAL_TABLET | Freq: Every day | ORAL | Status: DC

## 2011-08-24 NOTE — Progress Notes (Signed)
Subjective:    Patient ID: Beth Romero, female    DOB: March 27, 1966, 46 y.o.   MRN: [id number]  HPI 46 year old [place] American female who is in today with complaint of nasal congestion, eyes burning, sneezing, cough has been going on for one day. She has not taken anything over-the-counter. She denies any fever.   Review of Systems  Constitutional: Positive for fatigue.  HENT: Positive for congestion, sneezing and postnasal drip.   Eyes: Positive for itching.  Respiratory: Positive for cough.   Cardiovascular: Negative.   Musculoskeletal: Negative.   Skin: Negative.   Neurological: Negative.   Hematological: Negative.   Psychiatric/Behavioral: Negative.    Past Medical History  Diagnosis Date  . Abnormal Pap smear of cervix     rx cryo remote  has been normal  . Migraine   . Atrial septal defect     large echo 2008 and 2006  declines any surgical correction for belief reasons consutlts with [facility] . pt aware of risk of irreverible Pulm ht causind devility and death is possible without correction. declines surgery and is not a candidate for other procedures.   . Murmur   . Syncopal episodes   . Hx of abnormal cervical Pap smear     History   Social History  . Marital Status: Married    Spouse Name: N/A    Number of Children: N/A  . Years of Education: N/A   Occupational History  . Not on file.   Social History Main Topics  . Smoking status: Never Smoker   . Smokeless tobacco: Not on file  . Alcohol Use: No  . Drug Use: No  . Sexually Active: Not on file   Other Topics Concern  . Not on file   Social History Narrative   Occupation: works at [employer] 40 hours weekMarriedHusband currently [employer] exercise- noHH of 7 on the [employer] on sat doesn't celebrate holidays    Past Surgical History  Procedure Date  . US echocardiography     2008 and 2006  . 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 Hosp  11/11/11    [facility] weakness r/o TIA neg eval  . Tubal ligation     Family History  Problem Relation Age of Onset  . Breast cancer Mother   . Heart disease    . Other      step sister "hole in the heart"    No Known Allergies  Current Outpatient Prescriptions on File Prior to Visit  Medication Sig Dispense Refill  . Vitamin D, Ergocalciferol, (DRISDOL) 50000 UNITS CAPS Take 1 capsule (50,000 Units total) by mouth every 7 (seven) days.  12 capsule  0    BP 98/72  Temp(Src) 97.8 F (36.6 C) (Oral)  Wt 128 lb (58.06 kg)chart    Objective:   Physical Exam  Constitutional: She is oriented to person, place, and time. She appears well-developed and well-nourished.  HENT:  Right Ear: External ear normal.  Left Ear: External ear normal.  Nose: Nose normal.  Mouth/Throat: Oropharynx is clear and moist.  Neck: Normal range of motion. Neck supple.  Cardiovascular: Normal rate, regular rhythm and normal heart sounds.   Pulmonary/Chest: Effort normal.  Musculoskeletal: Normal range of motion.  Neurological: She is alert and oriented to person, place, and time.  Skin: Skin is warm and dry.  Psychiatric: She has a normal mood and affect.  Assessment & Plan:  Assessment: Upper respiratory infection  Plan: Allegra-D 24 hours once daily. Flonase 2 sprays in each nostril once a day. Patient call the office if symptoms worsen or persist. Rest. Drink plenty of fluids.

## 2011-08-24 NOTE — Patient Instructions (Signed)

## 2011-11-11 HISTORY — PX: OTHER SURGICAL HISTORY: SHX169

## 2012-05-16 ENCOUNTER — Telehealth: Payer: Self-pay | Admitting: Internal Medicine

## 2012-05-16 NOTE — Telephone Encounter (Signed)
Would you like to fit her in or have her see Padonda?

## 2012-05-16 NOTE — Telephone Encounter (Signed)
Patient called stating that she is required to have a cpx by the end of December per her employer and there are no appts available patient would be willing to see the NP if necessary. Please advise.

## 2012-05-17 ENCOUNTER — Telehealth: Payer: Self-pay | Admitting: Internal Medicine

## 2012-05-17 NOTE — Telephone Encounter (Signed)
I spoke with pt and she is requesting CPX early am or late pm.  Nelva Bush is trying to schedule her now.

## 2012-05-17 NOTE — Telephone Encounter (Signed)
Pt has cpx sch for 07-09-2012 345pm. Pt would like a rx for cpx labs fax to her to take to [facility]. Please call patient [phone]) before faxing rx to [phone].

## 2012-05-17 NOTE — Telephone Encounter (Signed)
We can try to work her in   Any limitations about day of week and does it have to be after dec 5th?

## 2012-05-18 NOTE — Telephone Encounter (Signed)
Please do this cpx labs and vit d level  Dx v70.0 vit d deficiency

## 2012-05-21 NOTE — Telephone Encounter (Signed)
Pt notified and rx sent by fax.

## 2012-07-09 ENCOUNTER — Encounter: Payer: Self-pay | Admitting: Internal Medicine

## 2012-07-09 ENCOUNTER — Ambulatory Visit (INDEPENDENT_AMBULATORY_CARE_PROVIDER_SITE_OTHER): Payer: 59 | Admitting: Internal Medicine

## 2012-07-09 VITALS — BP 94/64 | HR 80 | Temp 97.8°F | Ht 64.5 in | Wt 134.0 lb

## 2012-07-09 DIAGNOSIS — Z0000 Encounter for general adult medical examination without abnormal findings: Secondary | ICD-10-CM

## 2012-07-09 DIAGNOSIS — E559 Vitamin D deficiency, unspecified: Secondary | ICD-10-CM | POA: Insufficient documentation

## 2012-07-09 DIAGNOSIS — Q211 Atrial septal defect: Secondary | ICD-10-CM

## 2012-07-09 NOTE — Patient Instructions (Addendum)
Your vitamin d level is very low .  If not taking a supplement try increase vit d foods.  Your heart murmur continues  , i am glad you are not having heart symptoms  . Will contact you about a follow up echo or heart check     Vitamin D Deficiency Vitamin D is an important vitamin that your body needs. Having too little of it in your body is called a deficiency. A very bad deficiency can make your bones soft and can cause a condition called rickets.  Vitamin D is important to your body for different reasons, such as:   It helps your body absorb 2 minerals called calcium and phosphorus.  It helps make your bones healthy.  It may prevent some diseases, such as diabetes and multiple sclerosis.  It helps your muscles and heart. You can get vitamin D in several ways. It is a natural part of some foods. The vitamin is also added to some dairy products and cereals. Some people take vitamin D supplements. Also, your body makes vitamin D when you are in the sun. It changes the sun's rays into a form of the vitamin that your body can use. CAUSES   Not eating enough foods that contain vitamin D.  Not getting enough sunlight.  Having certain digestive system diseases that make it hard to absorb vitamin D. These diseases include Crohn's disease, chronic pancreatitis, and cystic fibrosis.  Having a surgery in which part of the stomach or small intestine is removed.  Being obese. Fat cells pull vitamin D out of your blood. That means that obese people may not have enough vitamin D left in their blood and in other body tissues.  Having chronic kidney or liver disease. RISK FACTORS Risk factors are things that make you more likely to develop a vitamin D deficiency. They include:  Being older.  Not being able to get outside very much.  Living in a nursing home.  Having had broken bones.  Having weak or thin bones (osteoporosis).  Having a disease or condition that changes how your body  absorbs vitamin D.  Having dark skin.  Some medicines such as seizure medicines or steroids.  Being overweight or obese. SYMPTOMS Mild cases of vitamin D deficiency may not have any symptoms. If you have a very bad case, symptoms may include:  Bone pain.  Muscle pain.  Falling often.  Broken bones caused by a minor injury, due to osteoporosis. DIAGNOSIS A blood test is the best way to tell if you have a vitamin D deficiency. TREATMENT Vitamin D deficiency can be treated in different ways. Treatment for vitamin D deficiency depends on what is causing it. Options include:  Taking vitamin D supplements.  Taking a calcium supplement. Your caregiver will suggest what dose is best for you. HOME CARE INSTRUCTIONS  Take any supplements that your caregiver prescribes. Follow the directions carefully. Take only the suggested amount.  Have your blood tested 2 months after you start taking supplements.  Eat foods that contain vitamin D. Healthy choices include:  Fortified dairy products, cereals, or juices. Fortified means vitamin D has been added to the food. Check the label on the package to be sure.  Fatty fish like salmon or trout.  Eggs.  Oysters.  Spend some time in the sun. Most people should get out in the sun without sunblock for 10 to 15 minutes at a time. Do this 3 times a week. People who have had skin  cancer should not do this. Ask your caregiver how long you should be in the sun. Do not use a tanning bed.  Keep your weight at a healthy level. Lose weight if you need to.  Keep all follow-up appointments. Your caregiver will need to perform blood tests to make sure your vitamin D deficiency is going away. SEEK MEDICAL CARE IF:  You have any questions about your treatment.  You continue to have symptoms of vitamin D deficiency.  You have nausea or vomiting.  You are constipated.  You feel confused.  You have severe abdominal or back pain. MAKE SURE  YOU:  Understand these instructions.  Will watch your condition.  Will get help right away if you are not doing well or get worse. Document Released: 10/10/2011 Document Reviewed: 10/10/2011 [facility] Patient Information 2013 [place], [place]. Preventive Care for Adults, Female A healthy lifestyle and preventive care can promote health and wellness. Preventive health guidelines for women include the following key practices.  A routine yearly physical is a good way to check with your caregiver about your health and preventive screening. It is a chance to share any concerns and updates on your health, and to receive a thorough exam.  Visit your dentist for a routine exam and preventive care every 6 months. Brush your teeth twice a day and floss once a day. Good oral hygiene prevents tooth decay and gum disease.  The frequency of eye exams is based on your age, health, family medical history, use of contact lenses, and other factors. Follow your caregiver's recommendations for frequency of eye exams.  Eat a healthy diet. Foods like vegetables, fruits, whole grains, low-fat dairy products, and lean protein foods contain the nutrients you need without too many calories. Decrease your intake of foods high in solid fats, added sugars, and salt. Eat the right amount of calories for you.Get information about a proper diet from your caregiver, if necessary.  Regular physical exercise is one of the most important things you can do for your health. Most adults should get at least 150 minutes of moderate-intensity exercise (any activity that increases your heart rate and causes you to sweat) each week. In addition, most adults need muscle-strengthening exercises on 2 or more days a week.  Maintain a healthy weight. The body mass index (BMI) is a screening tool to identify possible weight problems. It provides an estimate of body fat based on height and weight. Your caregiver can help determine your BMI, and  can help you achieve or maintain a healthy weight.For adults 20 years and older:  A BMI below 18.5 is considered underweight.  A BMI of 18.5 to 24.9 is normal.  A BMI of 25 to 29.9 is considered overweight.  A BMI of 30 and above is considered obese.  Maintain normal blood lipids and cholesterol levels by exercising and minimizing your intake of saturated fat. Eat a balanced diet with plenty of fruit and vegetables. Blood tests for lipids and cholesterol should begin at age 75 and be repeated every 5 years. If your lipid or cholesterol levels are high, you are over 50, or you are at high risk for heart disease, you may need your cholesterol levels checked more frequently.Ongoing high lipid and cholesterol levels should be treated with medicines if diet and exercise are not effective.  If you smoke, find out from your caregiver how to quit. If you do not use tobacco, do not start.  If you are pregnant, do not drink alcohol. If  you are breastfeeding, be very cautious about drinking alcohol. If you are not pregnant and choose to drink alcohol, do not exceed 1 drink per day. One drink is considered to be 12 ounces (355 mL) of beer, 5 ounces (148 mL) of wine, or 1.5 ounces (44 mL) of liquor.  Avoid use of street drugs. Do not share needles with anyone. Ask for help if you need support or instructions about stopping the use of drugs.  High blood pressure causes heart disease and increases the risk of stroke. Your blood pressure should be checked at least every 1 to 2 years. Ongoing high blood pressure should be treated with medicines if weight loss and exercise are not effective.  If you are 63 to 44 years old, ask your caregiver if you should take aspirin to prevent strokes.  Diabetes screening involves taking a blood sample to check your fasting blood sugar level. This should be done once every 3 years, after age 37, if you are within normal weight and without risk factors for diabetes. Testing  should be considered at a younger age or be carried out more frequently if you are overweight and have at least 1 risk factor for diabetes.  Breast cancer screening is essential preventive care for women. You should practice "breast self-awareness." This means understanding the normal appearance and feel of your breasts and may include breast self-examination. Any changes detected, no matter how small, should be reported to a caregiver. Women in their 66s and 30s should have a clinical breast exam (CBE) by a caregiver as part of a regular health exam every 1 to 3 years. After age 61, women should have a CBE every year. Starting at age 31, women should consider having a mammography (breast X-ray test) every year. Women who have a family history of breast cancer should talk to their caregiver about genetic screening. Women at a high risk of breast cancer should talk to their caregivers about having magnetic resonance imaging (MRI) and a mammography every year.  The Pap test is a screening test for cervical cancer. A Pap test can show cell changes on the cervix that might become cervical cancer if left untreated. A Pap test is a procedure in which cells are obtained and examined from the lower end of the uterus (cervix).  Women should have a Pap test starting at age 36.  Between ages 90 and 55, Pap tests should be repeated every 2 years.  Beginning at age 20, you should have a Pap test every 3 years as long as the past 3 Pap tests have been normal.  Some women have medical problems that increase the chance of getting cervical cancer. Talk to your caregiver about these problems. It is especially important to talk to your caregiver if a new problem develops soon after your last Pap test. In these cases, your caregiver may recommend more frequent screening and Pap tests.  The above recommendations are the same for women who have or have not gotten the vaccine for human papillomavirus (HPV).  If you had a  hysterectomy for a problem that was not cancer or a condition that could lead to cancer, then you no longer need Pap tests. Even if you no longer need a Pap test, a regular exam is a good idea to make sure no other problems are starting.  If you are between ages 59 and 41, and you have had normal Pap tests going back 10 years, you no longer need Pap tests. Even  if you no longer need a Pap test, a regular exam is a good idea to make sure no other problems are starting.  If you have had past treatment for cervical cancer or a condition that could lead to cancer, you need Pap tests and screening for cancer for at least 20 years after your treatment.  If Pap tests have been discontinued, risk factors (such as a new sexual partner) need to be reassessed to determine if screening should be resumed.  The HPV test is an additional test that may be used for cervical cancer screening. The HPV test looks for the virus that can cause the cell changes on the cervix. The cells collected during the Pap test can be tested for HPV. The HPV test could be used to screen women aged 16 years and older, and should be used in women of any age who have unclear Pap test results. After the age of 32, women should have HPV testing at the same frequency as a Pap test.  Colorectal cancer can be detected and often prevented. Most routine colorectal cancer screening begins at the age of 50 and continues through age 1. However, your caregiver may recommend screening at an earlier age if you have risk factors for colon cancer. On a yearly basis, your caregiver may provide home test kits to check for hidden blood in the stool. Use of a small camera at the end of a tube, to directly examine the colon (sigmoidoscopy or colonoscopy), can detect the earliest forms of colorectal cancer. Talk to your caregiver about this at age 24, when routine screening begins. Direct examination of the colon should be repeated every 5 to 10 years through age  70, unless early forms of pre-cancerous polyps or small growths are found.  Hepatitis C blood testing is recommended for all people born from [birth year] through 1965 and any individual with known risks for hepatitis C.  Practice safe sex. Use condoms and avoid high-risk sexual practices to reduce the spread of sexually transmitted infections (STIs). STIs include gonorrhea, chlamydia, syphilis, trichomonas, herpes, HPV, and human immunodeficiency virus (HIV). Herpes, HIV, and HPV are viral illnesses that have no cure. They can result in disability, cancer, and death. Sexually active women aged 3 and younger should be checked for chlamydia. Older women with new or multiple partners should also be tested for chlamydia. Testing for other STIs is recommended if you are sexually active and at increased risk.  Osteoporosis is a disease in which the bones lose minerals and strength with aging. This can result in serious bone fractures. The risk of osteoporosis can be identified using a bone density scan. Women ages 54 and over and women at risk for fractures or osteoporosis should discuss screening with their caregivers. Ask your caregiver whether you should take a calcium supplement or vitamin D to reduce the rate of osteoporosis.  Menopause can be associated with physical symptoms and risks. Hormone replacement therapy is available to decrease symptoms and risks. You should talk to your caregiver about whether hormone replacement therapy is right for you.  Use sunscreen with sun protection factor (SPF) of 30 or more. Apply sunscreen liberally and repeatedly throughout the day. You should seek shade when your shadow is shorter than you. Protect yourself by wearing long sleeves, pants, a wide-brimmed hat, and sunglasses year round, whenever you are outdoors.  Once a month, do a whole body skin exam, using a mirror to look at the skin on your back. Notify your caregiver  of new moles, moles that have irregular borders,  moles that are larger than a pencil eraser, or moles that have changed in shape or color.  Stay current with required immunizations.  Influenza. You need a dose every fall (or winter). The composition of the flu vaccine changes each year, so being vaccinated once is not enough.  Pneumococcal polysaccharide. You need 1 to 2 doses if you smoke cigarettes or if you have certain chronic medical conditions. You need 1 dose at age 15 (or older) if you have never been vaccinated.  Tetanus, diphtheria, pertussis (Tdap, Td). Get 1 dose of Tdap vaccine if you are younger than age 80, are over 53 and have contact with an infant, are a Research scientist (physical sciences), are pregnant, or simply want to be protected from whooping cough. After that, you need a Td booster dose every 10 years. Consult your caregiver if you have not had at least 3 tetanus and diphtheria-containing shots sometime in your life or have a deep or dirty wound.  HPV. You need this vaccine if you are a woman age 68 or younger. The vaccine is given in 3 doses over 6 months.  Measles, mumps, rubella (MMR). You need at least 1 dose of MMR if you were born in 1957 or later. You may also need a second dose.  Meningococcal. If you are age 25 to 24 and a first-year college student living in a residence hall, or have one of several medical conditions, you need to get vaccinated against meningococcal disease. You may also need additional booster doses.  Zoster (shingles). If you are age 41 or older, you should get this vaccine.  Varicella (chickenpox). If you have never had chickenpox or you were vaccinated but received only 1 dose, talk to your caregiver to find out if you need this vaccine.  Hepatitis A. You need this vaccine if you have a specific risk factor for hepatitis A virus infection or you simply wish to be protected from this disease. The vaccine is usually given as 2 doses, 6 to 18 months apart.  Hepatitis B. You need this vaccine if you have a  specific risk factor for hepatitis B virus infection or you simply wish to be protected from this disease. The vaccine is given in 3 doses, usually over 6 months. Preventive Services / Frequency Ages 26 to 23  Blood pressure check.** / Every 1 to 2 years.  Lipid and cholesterol check.** / Every 5 years beginning at age 8.  Clinical breast exam.** / Every 3 years for women in their 47s and 30s.  Pap test.** / Every 2 years from ages 24 through 35. Every 3 years starting at age 51 through age 16 or 47 with a history of 3 consecutive normal Pap tests.  HPV screening.** / Every 3 years from ages 30 through ages 84 to 77 with a history of 3 consecutive normal Pap tests.  Hepatitis C blood test.** / For any individual with known risks for hepatitis C.  Skin self-exam. / Monthly.  Influenza immunization.** / Every year.  Pneumococcal polysaccharide immunization.** / 1 to 2 doses if you smoke cigarettes or if you have certain chronic medical conditions.  Tetanus, diphtheria, pertussis (Tdap, Td) immunization. / A one-time dose of Tdap vaccine. After that, you need a Td booster dose every 10 years.  HPV immunization. / 3 doses over 6 months, if you are 10 and younger.  Measles, mumps, rubella (MMR) immunization. / You need at least 1 dose of  MMR if you were born in 1957 or later. You may also need a second dose.  Meningococcal immunization. / 1 dose if you are age 75 to 75 and a first-year college student living in a residence hall, or have one of several medical conditions, you need to get vaccinated against meningococcal disease. You may also need additional booster doses.  Varicella immunization.** / Consult your caregiver.  Hepatitis A immunization.** / Consult your caregiver. 2 doses, 6 to 18 months apart.  Hepatitis B immunization.** / Consult your caregiver. 3 doses usually over 6 months. Ages 23 to 33  Blood pressure check.** / Every 1 to 2 years.  Lipid and cholesterol  check.** / Every 5 years beginning at age 74.  Clinical breast exam.** / Every year after age 3.  Mammogram.** / Every year beginning at age 54 and continuing for as long as you are in good health. Consult with your caregiver.  Pap test.** / Every 3 years starting at age 73 through age 74 or 55 with a history of 3 consecutive normal Pap tests.  HPV screening.** / Every 3 years from ages 21 through ages 8 to 57 with a history of 3 consecutive normal Pap tests.  Fecal occult blood test (FOBT) of stool. / Every year beginning at age 65 and continuing until age 75. You may not need to do this test if you get a colonoscopy every 10 years.  Flexible sigmoidoscopy or colonoscopy.** / Every 5 years for a flexible sigmoidoscopy or every 10 years for a colonoscopy beginning at age 64 and continuing until age 70.  Hepatitis C blood test.** / For all people born from [birth year] through 1963 and any individual with known risks for hepatitis C.  Skin self-exam. / Monthly.  Influenza immunization.** / Every year.  Pneumococcal polysaccharide immunization.** / 1 to 2 doses if you smoke cigarettes or if you have certain chronic medical conditions.  Tetanus, diphtheria, pertussis (Tdap, Td) immunization.** / A one-time dose of Tdap vaccine. After that, you need a Td booster dose every 10 years.  Measles, mumps, rubella (MMR) immunization. / You need at least 1 dose of MMR if you were born in 1957 or later. You may also need a second dose.  Varicella immunization.** / Consult your caregiver.  Meningococcal immunization.** / Consult your caregiver.  Hepatitis A immunization.** / Consult your caregiver. 2 doses, 6 to 18 months apart.  Hepatitis B immunization.** / Consult your caregiver. 3 doses, usually over 6 months. Ages 30 and over  Blood pressure check.** / Every 1 to 2 years.  Lipid and cholesterol check.** / Every 5 years beginning at age 70.  Clinical breast exam.** / Every year after age  47.  Mammogram.** / Every year beginning at age 57 and continuing for as long as you are in good health. Consult with your caregiver.  Pap test.** / Every 3 years starting at age 59 through age 26 or 53 with a 3 consecutive normal Pap tests. Testing can be stopped between 65 and 70 with 3 consecutive normal Pap tests and no abnormal Pap or HPV tests in the past 10 years.  HPV screening.** / Every 3 years from ages 26 through ages 35 or 72 with a history of 3 consecutive normal Pap tests. Testing can be stopped between 65 and 70 with 3 consecutive normal Pap tests and no abnormal Pap or HPV tests in the past 10 years.  Fecal occult blood test (FOBT) of stool. / Every year beginning at  age 61 and continuing until age 69. You may not need to do this test if you get a colonoscopy every 10 years.  Flexible sigmoidoscopy or colonoscopy.** / Every 5 years for a flexible sigmoidoscopy or every 10 years for a colonoscopy beginning at age 55 and continuing until age 70.  Hepatitis C blood test.** / For all people born from [birth year] through 1963 and any individual with known risks for hepatitis C.  Osteoporosis screening.** / A one-time screening for women ages 39 and over and women at risk for fractures or osteoporosis.  Skin self-exam. / Monthly.  Influenza immunization.** / Every year.  Pneumococcal polysaccharide immunization.** / 1 dose at age 59 (or older) if you have never been vaccinated.  Tetanus, diphtheria, pertussis (Tdap, Td) immunization. / A one-time dose of Tdap vaccine if you are over 65 and have contact with an infant, are a Research scientist (physical sciences), or simply want to be protected from whooping cough. After that, you need a Td booster dose every 10 years.  Varicella immunization.** / Consult your caregiver.  Meningococcal immunization.** / Consult your caregiver.  Hepatitis A immunization.** / Consult your caregiver. 2 doses, 6 to 18 months apart.  Hepatitis B immunization.** / Check with  your caregiver. 3 doses, usually over 6 months. ** Family history and personal history of risk and conditions may change your caregiver's recommendations. Document Released: 09/13/2001 Document Revised: 10/10/2011 Document Reviewed: 12/13/2010 [facility] Patient Information 2013 [place], [place].

## 2012-07-09 NOTE — Progress Notes (Signed)
Chief Complaint  Patient presents with  . Annual Exam    HPI: Patient comes in today for Preventive Health Care visit   No major change in health status since last visit . Has menses ocass last October  4-5 days says no sig menopausal sx .  Doesn't have allergy denies cp sob but no sig exercise and work is desk work  Surveyor, minerals  Working 8- 5 pm     Sleep ok ocass wakening .  Not taing vit d will ponly do certain foods and natural  .  didt take  Vit d  As rx from last time  .  ROS:  GEN/ HEENT: No fever, significant weight changes sweats headaches vision problems hearing changes, CV/ PULM; No chest pain shortness of breath cough, syncope,edema  change in exercise tolerance. GI /GU: No adominal pain, vomiting, change in bowel habits. No blood in the stool. No significant GU symptoms. SKIN/HEME: ,no acute skin rashes suspicious lesions or bleeding. No lymphadenopathy, nodules, masses.  NEURO/ PSYCH:  No neurologic signs such as weakness numbness. No depression anxiety. IMM/ Allergy: No unusual infections.  Allergy .   Says doesn't get this.  REST of 12 system review negative except as per HPI Past Medical History  Diagnosis Date  . Abnormal Pap smear of cervix     rx cryo remote  has been normal  . Migraine   . Atrial septal defect     large echo 2008 and 2006  declines any surgical correction for belief reasons consutlts with [facility] . pt aware of risk of irreverible Pulm ht causind devility and death is possible without correction. declines surgery and is not a candidate for other procedures.   . Murmur   . Syncopal episodes   . Hx of abnormal cervical Pap smear     Family History  Problem Relation Age of Onset  . Breast cancer Mother   . Heart disease    . Other      step sister "hole in the heart"    History   Social History  . Marital Status: Married    Spouse Name: N/A    Number of Children: N/A  . Years of Education: N/A   Social History Main Topics   . Smoking status: Never Smoker   . Smokeless tobacco: None  . Alcohol Use: No  . Drug Use: No  . Sexually Active: None   Other Topics Concern  . 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 [DISCONTINUED] fexofenadine-pseudoephedrine (ALLEGRA-D 24) 180-240 MG per 24 hr tablet Take 1 tablet by mouth daily.  30 tablet  2  . [DISCONTINUED] fluticasone (FLONASE) 50 MCG/ACT nasal spray Place 2 sprays into the nose daily.  16 g  6  . [DISCONTINUED] Vitamin D, Ergocalciferol, (DRISDOL) 50000 UNITS CAPS Take 1 capsule (50,000 Units total) by mouth every 7 (seven) days.  12 capsule  0    EXAM:  BP 94/64  Pulse 80  Temp 97.8 F (36.6 C) (Oral)  Ht 5' 4.5" (1.638 m)  Wt 134 lb (60.782 kg)  BMI 22.65 kg/m2  SpO2 100%  LMP 05/01/2012  Body mass index is 22.65 kg/(m^2).  Physical Exam: Vital signs reviewed WUJ:WJXB is a  well-developed well-nourished alert cooperative   female who appears her stated age in no acute distress.  HEENT: normocephalic atraumatic , Eyes: PERRL EOM's full, conjunctiva clear, Nares: paten,t no deformity discharge or tenderness., Ears: no deformity EAC's clear TMs with normal landmarks. Mouth: clear OP, no lesions, edema.  Moist mucous membranes. Dentition in adequate repair. NECK: supple without masses, thyromegaly or bruits. CHEST/PULM:  Clear to auscultation and percussion breath sounds equal no wheeze , rales or rhonchi. No chest wall deformities or tenderness. CV: PMI is nondisplaced, S1 S2 no gallops, 2-3 /6 long systolic  Murmurs, ? Fixed s2 no  rubs. Peripheral pulses are full without delay.No JVD .  No clubbing cyanosis or edema  ABDOMEN: Bowel sounds normal nontender  No  guard or rebound, no hepato splenomegal no CVA tenderness.  No hernia. Extremtities:  No clubbing cyanosis or edema, no acute joint swelling or redness no focal atrophy NEURO:  Oriented x3, cranial nerves 3-12 appear to be intact, no obvious focal weakness,gait within normal limits no abnormal reflexes or asymmetrical SKIN: No acute rashes normal turgor, color, no bruising or petechiae. PSYCH: Oriented, good eye contact, no obvious depression anxiety, cognition and judgment appear normal. LN: no cervical axillary inguinal adenopathy Lab from [facility] all normal cpx except vit d is 6    ASSESSMENT AND PLAN:  Discussed the following assessment and plan:  1. Visit for preventive health examination    2. ATRIAL SEPTAL DEFECT large uncorrected   2D Echocardiogram without contrast   large last cath 2009 has declined correction  last [facility]   3. Vitamin D deficiency     level; 6  will not take rx supp will look into "natural " supplementation.   4. Atrial septal defect    Preventive Health Care Counseled regarding healthy nutrition, exercise, sleep, injury prevention, calcium vit d and healthy weight .  Patient Care Team: Madelin Headings, MD as PCP - General Patient Instructions    Your vitamin d level is very low .  If not taking a supplement try increase vit d foods.  Your heart murmur continues  , i am glad you are not having heart symptoms  . Will contact you about a follow up echo or heart check     Vitamin D Deficiency Vitamin D is an important vitamin that your body needs. Having too little of it in your body is called a deficiency. A very bad deficiency can make your bones soft and can cause a condition called rickets.  Vitamin D is important to your body for different reasons, such as:   It helps your body absorb 2 minerals called calcium and phosphorus.  It helps make your bones healthy.  It may prevent some diseases, such as diabetes and multiple sclerosis.  It  helps your muscles and heart. You can get vitamin D in several ways. It is a natural part of some foods. The vitamin is also added to some dairy products and cereals. Some people take vitamin D supplements. Also, your body makes vitamin D when you are in the sun. It changes the sun's rays into a form of the vitamin that your body can use. CAUSES   Not eating enough foods that contain vitamin D.  Not getting enough sunlight.  Having certain digestive system diseases that make it hard to absorb vitamin D. These diseases include Crohn's disease, chronic pancreatitis, and cystic fibrosis.  Having a surgery in which part of the stomach or small intestine  is removed.  Being obese. Fat cells pull vitamin D out of your blood. That means that obese people may not have enough vitamin D left in their blood and in other body tissues.  Having chronic kidney or liver disease. RISK FACTORS Risk factors are things that make you more likely to develop a vitamin D deficiency. They include:  Being older.  Not being able to get outside very much.  Living in a nursing home.  Having had broken bones.  Having weak or thin bones (osteoporosis).  Having a disease or condition that changes how your body absorbs vitamin D.  Having dark skin.  Some medicines such as seizure medicines or steroids.  Being overweight or obese. SYMPTOMS Mild cases of vitamin D deficiency may not have any symptoms. If you have a very bad case, symptoms may include:  Bone pain.  Muscle pain.  Falling often.  Broken bones caused by a minor injury, due to osteoporosis. DIAGNOSIS A blood test is the best way to tell if you have a vitamin D deficiency. TREATMENT Vitamin D deficiency can be treated in different ways. Treatment for vitamin D deficiency depends on what is causing it. Options include:  Taking vitamin D supplements.  Taking a calcium supplement. Your caregiver will suggest what dose is best for you. HOME  CARE INSTRUCTIONS  Take any supplements that your caregiver prescribes. Follow the directions carefully. Take only the suggested amount.  Have your blood tested 2 months after you start taking supplements.  Eat foods that contain vitamin D. Healthy choices include:  Fortified dairy products, cereals, or juices. Fortified means vitamin D has been added to the food. Check the label on the package to be sure.  Fatty fish like salmon or trout.  Eggs.  Oysters.  Spend some time in the sun. Most people should get out in the sun without sunblock for 10 to 15 minutes at a time. Do this 3 times a week. People who have had skin cancer should not do this. Ask your caregiver how long you should be in the sun. Do not use a tanning bed.  Keep your weight at a healthy level. Lose weight if you need to.  Keep all follow-up appointments. Your caregiver will need to perform blood tests to make sure your vitamin D deficiency is going away. SEEK MEDICAL CARE IF:  You have any questions about your treatment.  You continue to have symptoms of vitamin D deficiency.  You have nausea or vomiting.  You are constipated.  You feel confused.  You have severe abdominal or back pain. MAKE SURE YOU:  Understand these instructions.  Will watch your condition.  Will get help right away if you are not doing well or get worse. Document Released: 10/10/2011 Document Reviewed: 10/10/2011 [facility] Patient Information 2013 [place], [place]. Preventive Care for Adults, Female A healthy lifestyle and preventive care can promote health and wellness. Preventive health guidelines for women include the following key practices.  A routine yearly physical is a good way to check with your caregiver about your health and preventive screening. It is a chance to share any concerns and updates on your health, and to receive a thorough exam.  Visit your dentist for a routine exam and preventive care every 6 months. Brush  your teeth twice a day and floss once a day. Good oral hygiene prevents tooth decay and gum disease.  The frequency of eye exams is based on your age, health, family medical history, use of contact  lenses, and other factors. Follow your caregiver's recommendations for frequency of eye exams.  Eat a healthy diet. Foods like vegetables, fruits, whole grains, low-fat dairy products, and lean protein foods contain the nutrients you need without too many calories. Decrease your intake of foods high in solid fats, added sugars, and salt. Eat the right amount of calories for you.Get information about a proper diet from your caregiver, if necessary.  Regular physical exercise is one of the most important things you can do for your health. Most adults should get at least 150 minutes of moderate-intensity exercise (any activity that increases your heart rate and causes you to sweat) each week. In addition, most adults need muscle-strengthening exercises on 2 or more days a week.  Maintain a healthy weight. The body mass index (BMI) is a screening tool to identify possible weight problems. It provides an estimate of body fat based on height and weight. Your caregiver can help determine your BMI, and can help you achieve or maintain a healthy weight.For adults 20 years and older:  A BMI below 18.5 is considered underweight.  A BMI of 18.5 to 24.9 is normal.  A BMI of 25 to 29.9 is considered overweight.  A BMI of 30 and above is considered obese.  Maintain normal blood lipids and cholesterol levels by exercising and minimizing your intake of saturated fat. Eat a balanced diet with plenty of fruit and vegetables. Blood tests for lipids and cholesterol should begin at age 41 and be repeated every 5 years. If your lipid or cholesterol levels are high, you are over 50, or you are at high risk for heart disease, you may need your cholesterol levels checked more frequently.Ongoing high lipid and cholesterol  levels should be treated with medicines if diet and exercise are not effective.  If you smoke, find out from your caregiver how to quit. If you do not use tobacco, do not start.  If you are pregnant, do not drink alcohol. If you are breastfeeding, be very cautious about drinking alcohol. If you are not pregnant and choose to drink alcohol, do not exceed 1 drink per day. One drink is considered to be 12 ounces (355 mL) of beer, 5 ounces (148 mL) of wine, or 1.5 ounces (44 mL) of liquor.  Avoid use of street drugs. Do not share needles with anyone. Ask for help if you need support or instructions about stopping the use of drugs.  High blood pressure causes heart disease and increases the risk of stroke. Your blood pressure should be checked at least every 1 to 2 years. Ongoing high blood pressure should be treated with medicines if weight loss and exercise are not effective.  If you are 72 to 44 years old, ask your caregiver if you should take aspirin to prevent strokes.  Diabetes screening involves taking a blood sample to check your fasting blood sugar level. This should be done once every 3 years, after age 50, if you are within normal weight and without risk factors for diabetes. Testing should be considered at a younger age or be carried out more frequently if you are overweight and have at least 1 risk factor for diabetes.  Breast cancer screening is essential preventive care for women. You should practice "breast self-awareness." This means understanding the normal appearance and feel of your breasts and may include breast self-examination. Any changes detected, no matter how small, should be reported to a caregiver. Women in their 54s and 30s should have a clinical  breast exam (CBE) by a caregiver as part of a regular health exam every 1 to 3 years. After age 71, women should have a CBE every year. Starting at age 29, women should consider having a mammography (breast X-ray test) every year.  Women who have a family history of breast cancer should talk to their caregiver about genetic screening. Women at a high risk of breast cancer should talk to their caregivers about having magnetic resonance imaging (MRI) and a mammography every year.  The Pap test is a screening test for cervical cancer. A Pap test can show cell changes on the cervix that might become cervical cancer if left untreated. A Pap test is a procedure in which cells are obtained and examined from the lower end of the uterus (cervix).  Women should have a Pap test starting at age 60.  Between ages 34 and 71, Pap tests should be repeated every 2 years.  Beginning at age 45, you should have a Pap test every 3 years as long as the past 3 Pap tests have been normal.  Some women have medical problems that increase the chance of getting cervical cancer. Talk to your caregiver about these problems. It is especially important to talk to your caregiver if a new problem develops soon after your last Pap test. In these cases, your caregiver may recommend more frequent screening and Pap tests.  The above recommendations are the same for women who have or have not gotten the vaccine for human papillomavirus (HPV).  If you had a hysterectomy for a problem that was not cancer or a condition that could lead to cancer, then you no longer need Pap tests. Even if you no longer need a Pap test, a regular exam is a good idea to make sure no other problems are starting.  If you are between ages 70 and 80, and you have had normal Pap tests going back 10 years, you no longer need Pap tests. Even if you no longer need a Pap test, a regular exam is a good idea to make sure no other problems are starting.  If you have had past treatment for cervical cancer or a condition that could lead to cancer, you need Pap tests and screening for cancer for at least 20 years after your treatment.  If Pap tests have been discontinued, risk factors (such as a  new sexual partner) need to be reassessed to determine if screening should be resumed.  The HPV test is an additional test that may be used for cervical cancer screening. The HPV test looks for the virus that can cause the cell changes on the cervix. The cells collected during the Pap test can be tested for HPV. The HPV test could be used to screen women aged 8 years and older, and should be used in women of any age who have unclear Pap test results. After the age of 10, women should have HPV testing at the same frequency as a Pap test.  Colorectal cancer can be detected and often prevented. Most routine colorectal cancer screening begins at the age of 71 and continues through age 28. However, your caregiver may recommend screening at an earlier age if you have risk factors for colon cancer. On a yearly basis, your caregiver may provide home test kits to check for hidden blood in the stool. Use of a small camera at the end of a tube, to directly examine the colon (sigmoidoscopy or colonoscopy), can detect the earliest forms  of colorectal cancer. Talk to your caregiver about this at age 39, when routine screening begins. Direct examination of the colon should be repeated every 5 to 10 years through age 71, unless early forms of pre-cancerous polyps or small growths are found.  Hepatitis C blood testing is recommended for all people born from [birth year] through 1965 and any individual with known risks for hepatitis C.  Practice safe sex. Use condoms and avoid high-risk sexual practices to reduce the spread of sexually transmitted infections (STIs). STIs include gonorrhea, chlamydia, syphilis, trichomonas, herpes, HPV, and human immunodeficiency virus (HIV). Herpes, HIV, and HPV are viral illnesses that have no cure. They can result in disability, cancer, and death. Sexually active women aged 33 and younger should be checked for chlamydia. Older women with new or multiple partners should also be tested for  chlamydia. Testing for other STIs is recommended if you are sexually active and at increased risk.  Osteoporosis is a disease in which the bones lose minerals and strength with aging. This can result in serious bone fractures. The risk of osteoporosis can be identified using a bone density scan. Women ages 59 and over and women at risk for fractures or osteoporosis should discuss screening with their caregivers. Ask your caregiver whether you should take a calcium supplement or vitamin D to reduce the rate of osteoporosis.  Menopause can be associated with physical symptoms and risks. Hormone replacement therapy is available to decrease symptoms and risks. You should talk to your caregiver about whether hormone replacement therapy is right for you.  Use sunscreen with sun protection factor (SPF) of 30 or more. Apply sunscreen liberally and repeatedly throughout the day. You should seek shade when your shadow is shorter than you. Protect yourself by wearing long sleeves, pants, a wide-brimmed hat, and sunglasses year round, whenever you are outdoors.  Once a month, do a whole body skin exam, using a mirror to look at the skin on your back. Notify your caregiver of new moles, moles that have irregular borders, moles that are larger than a pencil eraser, or moles that have changed in shape or color.  Stay current with required immunizations.  Influenza. You need a dose every fall (or winter). The composition of the flu vaccine changes each year, so being vaccinated once is not enough.  Pneumococcal polysaccharide. You need 1 to 2 doses if you smoke cigarettes or if you have certain chronic medical conditions. You need 1 dose at age 68 (or older) if you have never been vaccinated.  Tetanus, diphtheria, pertussis (Tdap, Td). Get 1 dose of Tdap vaccine if you are younger than age 73, are over 36 and have contact with an infant, are a Research scientist (physical sciences), are pregnant, or simply want to be protected from  whooping cough. After that, you need a Td booster dose every 10 years. Consult your caregiver if you have not had at least 3 tetanus and diphtheria-containing shots sometime in your life or have a deep or dirty wound.  HPV. You need this vaccine if you are a woman age 16 or younger. The vaccine is given in 3 doses over 6 months.  Measles, mumps, rubella (MMR). You need at least 1 dose of MMR if you were born in 1957 or later. You may also need a second dose.  Meningococcal. If you are age 65 to 80 and a first-year college student living in a residence hall, or have one of several medical conditions, you need to get vaccinated against meningococcal  disease. You may also need additional booster doses.  Zoster (shingles). If you are age 54 or older, you should get this vaccine.  Varicella (chickenpox). If you have never had chickenpox or you were vaccinated but received only 1 dose, talk to your caregiver to find out if you need this vaccine.  Hepatitis A. You need this vaccine if you have a specific risk factor for hepatitis A virus infection or you simply wish to be protected from this disease. The vaccine is usually given as 2 doses, 6 to 18 months apart.  Hepatitis B. You need this vaccine if you have a specific risk factor for hepatitis B virus infection or you simply wish to be protected from this disease. The vaccine is given in 3 doses, usually over 6 months. Preventive Services / Frequency Ages 44 to 69  Blood pressure check.** / Every 1 to 2 years.  Lipid and cholesterol check.** / Every 5 years beginning at age 69.  Clinical breast exam.** / Every 3 years for women in their 31s and 30s.  Pap test.** / Every 2 years from ages 66 through 20. Every 3 years starting at age 29 through age 74 or 59 with a history of 3 consecutive normal Pap tests.  HPV screening.** / Every 3 years from ages 75 through ages 80 to 4 with a history of 3 consecutive normal Pap tests.  Hepatitis C blood  test.** / For any individual with known risks for hepatitis C.  Skin self-exam. / Monthly.  Influenza immunization.** / Every year.  Pneumococcal polysaccharide immunization.** / 1 to 2 doses if you smoke cigarettes or if you have certain chronic medical conditions.  Tetanus, diphtheria, pertussis (Tdap, Td) immunization. / A one-time dose of Tdap vaccine. After that, you need a Td booster dose every 10 years.  HPV immunization. / 3 doses over 6 months, if you are 68 and younger.  Measles, mumps, rubella (MMR) immunization. / You need at least 1 dose of MMR if you were born in 1957 or later. You may also need a second dose.  Meningococcal immunization. / 1 dose if you are age 35 to 60 and a first-year college student living in a residence hall, or have one of several medical conditions, you need to get vaccinated against meningococcal disease. You may also need additional booster doses.  Varicella immunization.** / Consult your caregiver.  Hepatitis A immunization.** / Consult your caregiver. 2 doses, 6 to 18 months apart.  Hepatitis B immunization.** / Consult your caregiver. 3 doses usually over 6 months. Ages 32 to 90  Blood pressure check.** / Every 1 to 2 years.  Lipid and cholesterol check.** / Every 5 years beginning at age 16.  Clinical breast exam.** / Every year after age 76.  Mammogram.** / Every year beginning at age 3 and continuing for as long as you are in good health. Consult with your caregiver.  Pap test.** / Every 3 years starting at age 40 through age 34 or 24 with a history of 3 consecutive normal Pap tests.  HPV screening.** / Every 3 years from ages 73 through ages 66 to 51 with a history of 3 consecutive normal Pap tests.  Fecal occult blood test (FOBT) of stool. / Every year beginning at age 69 and continuing until age 34. You may not need to do this test if you get a colonoscopy every 10 years.  Flexible sigmoidoscopy or colonoscopy.** / Every 5 years  for a flexible sigmoidoscopy or every 10  years for a colonoscopy beginning at age 75 and continuing until age 65.  Hepatitis C blood test.** / For all people born from [birth year] through 1963 and any individual with known risks for hepatitis C.  Skin self-exam. / Monthly.  Influenza immunization.** / Every year.  Pneumococcal polysaccharide immunization.** / 1 to 2 doses if you smoke cigarettes or if you have certain chronic medical conditions.  Tetanus, diphtheria, pertussis (Tdap, Td) immunization.** / A one-time dose of Tdap vaccine. After that, you need a Td booster dose every 10 years.  Measles, mumps, rubella (MMR) immunization. / You need at least 1 dose of MMR if you were born in 1957 or later. You may also need a second dose.  Varicella immunization.** / Consult your caregiver.  Meningococcal immunization.** / Consult your caregiver.  Hepatitis A immunization.** / Consult your caregiver. 2 doses, 6 to 18 months apart.  Hepatitis B immunization.** / Consult your caregiver. 3 doses, usually over 6 months. Ages 56 and over  Blood pressure check.** / Every 1 to 2 years.  Lipid and cholesterol check.** / Every 5 years beginning at age 56.  Clinical breast exam.** / Every year after age 60.  Mammogram.** / Every year beginning at age 33 and continuing for as long as you are in good health. Consult with your caregiver.  Pap test.** / Every 3 years starting at age 74 through age 94 or 31 with a 3 consecutive normal Pap tests. Testing can be stopped between 65 and 70 with 3 consecutive normal Pap tests and no abnormal Pap or HPV tests in the past 10 years.  HPV screening.** / Every 3 years from ages 17 through ages 62 or 54 with a history of 3 consecutive normal Pap tests. Testing can be stopped between 65 and 70 with 3 consecutive normal Pap tests and no abnormal Pap or HPV tests in the past 10 years.  Fecal occult blood test (FOBT) of stool. / Every year beginning at age 99 and  continuing until age 25. You may not need to do this test if you get a colonoscopy every 10 years.  Flexible sigmoidoscopy or colonoscopy.** / Every 5 years for a flexible sigmoidoscopy or every 10 years for a colonoscopy beginning at age 82 and continuing until age 56.  Hepatitis C blood test.** / For all people born from [birth year] through 1963 and any individual with known risks for hepatitis C.  Osteoporosis screening.** / A one-time screening for women ages 25 and over and women at risk for fractures or osteoporosis.  Skin self-exam. / Monthly.  Influenza immunization.** / Every year.  Pneumococcal polysaccharide immunization.** / 1 dose at age 9 (or older) if you have never been vaccinated.  Tetanus, diphtheria, pertussis (Tdap, Td) immunization. / A one-time dose of Tdap vaccine if you are over 65 and have contact with an infant, are a Research scientist (physical sciences), or simply want to be protected from whooping cough. After that, you need a Td booster dose every 10 years.  Varicella immunization.** / Consult your caregiver.  Meningococcal immunization.** / Consult your caregiver.  Hepatitis A immunization.** / Consult your caregiver. 2 doses, 6 to 18 months apart.  Hepatitis B immunization.** / Check with your caregiver. 3 doses, usually over 6 months. ** Family history and personal history of risk and conditions may change your caregiver's recommendations. Document Released: 09/13/2001 Document Revised: 10/10/2011 Document Reviewed: 12/13/2010 [facility] Patient Information 2013 [place], [place].     Neta Mends. Christianne Zacher M.D.

## 2012-07-12 ENCOUNTER — Ambulatory Visit (HOSPITAL_COMMUNITY): Payer: 59 | Attending: Internal Medicine

## 2012-07-12 DIAGNOSIS — I079 Rheumatic tricuspid valve disease, unspecified: Secondary | ICD-10-CM | POA: Insufficient documentation

## 2012-07-12 DIAGNOSIS — R55 Syncope and collapse: Secondary | ICD-10-CM | POA: Insufficient documentation

## 2012-07-12 DIAGNOSIS — I379 Nonrheumatic pulmonary valve disorder, unspecified: Secondary | ICD-10-CM | POA: Insufficient documentation

## 2012-07-12 DIAGNOSIS — Q2111 Secundum atrial septal defect: Secondary | ICD-10-CM

## 2012-07-12 DIAGNOSIS — I517 Cardiomegaly: Secondary | ICD-10-CM | POA: Insufficient documentation

## 2012-07-12 DIAGNOSIS — Q211 Atrial septal defect: Secondary | ICD-10-CM

## 2012-07-12 NOTE — Progress Notes (Signed)
Echocardiogram performed.  

## 2012-07-19 ENCOUNTER — Encounter: Payer: Self-pay | Admitting: Internal Medicine

## 2012-10-01 ENCOUNTER — Encounter: Payer: Self-pay | Admitting: Internal Medicine

## 2012-10-01 ENCOUNTER — Ambulatory Visit (INDEPENDENT_AMBULATORY_CARE_PROVIDER_SITE_OTHER): Payer: 59 | Admitting: Internal Medicine

## 2012-10-01 VITALS — BP 100/66 | HR 82 | Temp 99.1°F | Wt 130.0 lb

## 2012-10-01 DIAGNOSIS — E559 Vitamin D deficiency, unspecified: Secondary | ICD-10-CM

## 2012-10-01 DIAGNOSIS — N951 Menopausal and female climacteric states: Secondary | ICD-10-CM

## 2012-10-01 DIAGNOSIS — Q211 Atrial septal defect: Secondary | ICD-10-CM

## 2012-10-01 DIAGNOSIS — R21 Rash and other nonspecific skin eruption: Secondary | ICD-10-CM

## 2012-10-01 MED ORDER — DOXYCYCLINE HYCLATE 100 MG PO CAPS: 100.0000 mg | ORAL_CAPSULE | Freq: Two times a day (BID) | ORAL | Status: DC

## 2012-10-01 NOTE — Patient Instructions (Signed)
Okay to use the topical cortisone medication. These do acts like an allergic reaction to a bite however it is atypical in where they are located.  If they're getting painful warm and swollen and can add an antibiotic for bacterial secondary infection.   Antihistamines can also help with the itching but these would be oral medicine such as Benadryl or Zyrtec.  If your hot flashes are getting very severe in your really losing lots of weight we should probably recheck her thyroid test even though it was normal in December.  Followup if this is happening.

## 2012-10-01 NOTE — Progress Notes (Signed)
Chief Complaint  Patient presents with  . Rash    Started on Saturday.  Rash on left leg is painful.    HPI: Patient comes in today for SDA for  new problem evaluation. \ Onset 2 days ago of itchy rash areas. They're also painful.   Onset about a week ago of a bump on her left forearm which she was at work it became swollen and eventually resolved without treatment over 7-8 days but there is some hyperpigmentation left there was no fluctuant pustules thought it was an insect bite. Then she has developed itchy area on the left shoulder and axilla for which she treated hydrocortisone in it is fading. She now has a crop of itchy painful bumps on the right shin area and a single one on the left lateral distal extremity  She doesn't like to take oral medicines so house and she used some hydrocortisone which helped the itching no systemic symptoms but does get what she calls hot flashes with menopause with chills and sweats. She also has a decreased appetite when she feels this way and has lost some weight.   She denies symptomatic of her heart disease per patient.  ROS: See pertinent positives and negatives per HPI.no fevers no others with same rashes and no new soaps or creams doesn't shave . Never had this before.   Past Medical History  Diagnosis Date  . Abnormal Pap smear of cervix     rx cryo remote  has been normal  . Migraine   . Atrial septal defect     large echo 2008 and 2006  declines any surgical correction for belief reasons consutlts with [facility] . pt aware of risk of irreverible Pulm ht causind devility and death is possible without correction. declines surgery and is not a candidate for other procedures.   . Murmur   . Syncopal episodes   . Hx of abnormal cervical Pap smear     Family History  Problem Relation Age of Onset  . Breast cancer Mother   . Heart disease    . Other      step sister "hole in the heart"    History   Social History  . Marital Status:  Married    Spouse Name: N/A    Number of Children: N/A  . Years of Education: N/A   Social History Main Topics  . Smoking status: Never Smoker   . Smokeless tobacco: None  . Alcohol Use: No  . Drug Use: No  . Sexually Active: None   Other Topics Concern  . 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 doxycycline (VIBRAMYCIN) 100 MG capsule Take 1 capsule (100 mg total) by mouth 2 (two) times daily.  20 capsule  0   No facility-administered encounter medications on file as of 10/01/2012.    EXAM:  BP 100/66  Pulse 82  Temp(Src) 99.1 F (37.3 C) (Oral)  Wt 130 lb (58.968 kg)  BMI 21.98 kg/m2  SpO2 98%  LMP 08/01/2012  Body mass index is 21.98 kg/(m^2).  GENERAL: vitals reviewed and listed above, alert,  oriented, appears well hydrated and in no acute distress  HEENT: atraumatic, conjunctiva  clear, no obvious abnormalities on inspection of external nose and ears OP : no lesion edema or exudate   NECK: no obvious masses on inspection palpation  No adenopaty or thyroimegaly   LUNGS: clear to auscultation bilaterally, no wheezes, rales or rhonchi, good air movement  CV: HRRR, loud systolic murmur no gallop  rr  no clubbing cyanosis or  peripheral edema nl cap refill   MS: moves all extremities without noticeable focal  abnormality  PSYCH: pleasant and cooperative, no obvious depression or anxiety Skin: Left lateral distal leg with about 3-4 cm of swelling with central induration no postural or marked no fluctuance or boil it is mildly tender right lower extremity with 3 smaller red in indurated areas with no necrosis postural fluctuance or abscess. These are  possibly related to hair follicle. Left upper arm axilla faded rash without skin changes. Last labs scanned in 12 13  Nl tsh low vit d  6 ASSESSMENT AND PLAN:  Discussed the following assessment and plan:  Rash - uncertain cause itching goes with allergic reaction but has some pain with this and areas  are on 23 extremities;consider atypical e nodosum/Bug bites/ infect  Vitamin D deficiency - declined oral meds  Atrial septal defect - declined surgery   Hot flushes, perimenopausal - presumed perimenopausal but some weight loss/ atypical and should fu if more severe and continuing  no obv cv decompnsation andsob pulm ht. Am uncertain of the cause of her rashes however if there is a risk of bacterial infection and given her a prescription for doxycycline that she can choose to add on if however it looks with pustules and abscess formation she needs to contact  Office.  She usually declines oral meds   Unless absolutely necessary. -Patient advised to return or notify health care team  if symptoms worsen or persist or new concerns arise.  Patient Instructions  Okay to use the topical cortisone medication. These do acts like an allergic reaction to a bite however it is atypical in where they are located.  If they're getting painful warm and swollen and can add an antibiotic for bacterial secondary infection.   Antihistamines can also help with the itching but these would be oral medicine such as Benadryl or Zyrtec.  If your hot flashes are getting very severe in your really losing lots of weight we should probably recheck her thyroid test even though it was normal in December.  Followup if this is happening.    Neta Mends. Panosh M.D.

## 2012-12-07 ENCOUNTER — Encounter: Payer: Self-pay | Admitting: Internal Medicine

## 2012-12-07 ENCOUNTER — Ambulatory Visit (INDEPENDENT_AMBULATORY_CARE_PROVIDER_SITE_OTHER): Payer: 59 | Admitting: Internal Medicine

## 2012-12-07 VITALS — BP 96/74 | HR 76 | Temp 98.0°F | Wt 133.0 lb

## 2012-12-07 DIAGNOSIS — Z72820 Sleep deprivation: Secondary | ICD-10-CM

## 2012-12-07 DIAGNOSIS — R29818 Other symptoms and signs involving the nervous system: Secondary | ICD-10-CM

## 2012-12-07 DIAGNOSIS — R2990 Unspecified symptoms and signs involving the nervous system: Secondary | ICD-10-CM

## 2012-12-07 DIAGNOSIS — Q211 Atrial septal defect: Secondary | ICD-10-CM

## 2012-12-07 HISTORY — DX: Unspecified symptoms and signs involving the nervous system: R29.90

## 2012-12-07 NOTE — Patient Instructions (Addendum)
Fortunately you exam looks good today . The murmur seem the same.Beth Romero  Uncertain is this could be a tia or not.  As opposed to sleep deprivation.  Plan doppler of neck arteries  Echo of heart and MRI of head  With contrast  Advise take asa q d in the short run. Plan neurology consult as ap. Go to the ed  If any acute symptoms  of a stroke

## 2012-12-07 NOTE — Progress Notes (Signed)
Chief Complaint  Patient presents with  . Fatigue    Became disoriented at work yesterday.  Since then she has been very tired and sleepy.  She says she feels like she did after she had her stroke.    HPI: Patient comes in today for SDA for  new problem evaluation. Sitting at desk and something felt wrong left side of head  ? If insidious onset vs acute onset.  And felt tired and fatigue ? If from sleep deprivation  From hot flushes .  ? If something went on in head.   Felt off balance and thought process seems different.    Harder to commiunicate and through process seemed slower .  No speech difficulty and  No numbness or weakness . Syncope No LOC   No syncope no meds  ? Had tingling in arm sensation right hand  Hard to remember what happened.   Some stomache ache but no nausea  Feels like aftereffect.  More tired than a blance problem today .   No real headache.  Coworker brought her home .  ROS: See pertinent positives and negatives per HPI.no vomiting fever chills   Past Medical History  Diagnosis Date  . Abnormal Pap smear of cervix     rx cryo remote  has been normal  . Migraine   . Atrial septal defect     large echo 2008 and 2006  declines any surgical correction for belief reasons consutlts with [facility] . pt aware of risk of irreverible Pulm ht causind devility and death is possible without correction. declines surgery and is not a candidate for other procedures.   . Murmur   . Syncopal episodes   . Hx of abnormal cervical Pap smear     Family History  Problem Relation Age of Onset  . Breast cancer Mother   . Heart disease    . Other      step sister "hole in the heart"    History   Social History  . Marital Status: Married    Spouse Name: N/A    Number of Children: N/A  . Years of Education: N/A   Social History Main Topics  . Smoking status: Never Smoker   . Smokeless tobacco: None  . Alcohol Use: No  . Drug Use: No  . Sexually Active: None   Other  Topics Concern  . 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 [DISCONTINUED] doxycycline (VIBRAMYCIN) 100 MG capsule Take 1 capsule (100 mg total) by mouth 2 (two) times daily.  20 capsule  0   No facility-administered encounter medications on file as of 12/07/2012.    EXAM:  BP 96/74  Pulse 76  Temp(Src) 98 F (36.7 C) (Oral)  Wt 133 lb (60.328 kg)  BMI 22.48 kg/m2  SpO2 98%  LMP 08/01/2012  Body mass index is 22.48 kg/(m^2).  GENERAL: vitals reviewed and listed above, alert, oriented, appears well hydrated and in no acute distress looks tired  Verbally intact   HEENT: atraumatic, conjunctiva  clear, no obvious abnormalities on inspection of external nose and ears  tms clear  ion edema or exudate  NECK: no obvious masses on inspection palpation  No jvd  LUNGS: clear to auscultation bilaterally, no wheezes, rales or rhonchi, good air movement CV: HRRR ocass premature beat .   3/6 systolic murmur along mid  chest  ? s2 characterization   , no clubbing cyanosis or  peripheral edema nl cap refill  MS: moves all extremities without noticeable focal  abnormality NEURO: oriented x 3 CN 3-12 appear intact. No focal muscle weakness or atrophy. DTRs symmetrical. Gait WNL.  Grossly non focal. No tremor or abnormal movement. PSYCH: pleasant and cooperative, no obvious depression or anxiety reviewed of old record   No abnormalities on  mri and angiogram in the past   2010   Had  hospital evaluation   Pt calls the event a stroke but normal scans  ASSESSMENT AND PLAN:  Discussed the following assessment and plan:  Spell of transient neurologic symptoms - non focal exam today  no  other alarming finding needs evaluation.  - Plan: 2D Echocardiogram without contrast, MR Brain W Wo Contrast, Ambulatory referral to Neurology, Doppler carotid, CANCELED: US Carotid Duplex Bilateral  Sleep deprivation - Plan: 2D Echocardiogram without contrast, MR Brain W Wo Contrast, Ambulatory referral to Neurology, Doppler carotid, CANCELED: US Carotid Duplex Bilateral  ASD (atrial septal defect) - Plan: 2D Echocardiogram without contrast, MR Brain W Wo Contrast, Ambulatory referral to Neurology, Doppler carotid, CANCELED: US Carotid Duplex Bilateral Patient has declined any surgical intervention on her ASD couple times with multiple discussions See  previous cardiology consults [facility]  the last one.  Uncertain status of pulmonary pressures or shunting. However her exam appears to be no change.  We discussed taking aspirin patient will probably not take this. -Patient advised to return or notify health care team  if symptoms worsen or persist or new concerns arise.  Patient Instructions  Fortunately you exam looks good today . The murmur seem the same.Marland Kitchen  Uncertain is this could be a tia or not.  As opposed to sleep deprivation.  Plan doppler of neck arteries  Echo of heart and MRI of head  With contrast  Advise take asa q d in the short run. Plan neurology consult as ap. Go to the ed  If any acute symptoms  of a stroke    Neta Mends. Ginevra Tacker M.D.  Labs done in December were normal

## 2012-12-10 ENCOUNTER — Ambulatory Visit (HOSPITAL_COMMUNITY): Payer: 59 | Attending: Cardiology | Admitting: Radiology

## 2012-12-10 DIAGNOSIS — R5383 Other fatigue: Secondary | ICD-10-CM | POA: Insufficient documentation

## 2012-12-10 DIAGNOSIS — R079 Chest pain, unspecified: Secondary | ICD-10-CM | POA: Insufficient documentation

## 2012-12-10 DIAGNOSIS — Q211 Atrial septal defect: Secondary | ICD-10-CM

## 2012-12-10 DIAGNOSIS — Z72820 Sleep deprivation: Secondary | ICD-10-CM

## 2012-12-10 DIAGNOSIS — Q2111 Secundum atrial septal defect: Secondary | ICD-10-CM | POA: Insufficient documentation

## 2012-12-10 DIAGNOSIS — R2990 Unspecified symptoms and signs involving the nervous system: Secondary | ICD-10-CM

## 2012-12-10 DIAGNOSIS — R5381 Other malaise: Secondary | ICD-10-CM | POA: Insufficient documentation

## 2012-12-10 NOTE — Progress Notes (Signed)
Echocardiogram performed.  

## 2012-12-11 ENCOUNTER — Telehealth: Payer: Self-pay | Admitting: Internal Medicine

## 2012-12-11 ENCOUNTER — Other Ambulatory Visit: Payer: 59

## 2012-12-11 NOTE — Telephone Encounter (Signed)
I don't have any control over insurance coverage or cost.  Can cancel the order as you have done per patient preference   Is even more important now to have her see neurology as soon as possible. These make sure that she has a neurology appointment. If they feel she has had a stroke or tia then    We need to get cardiology involved about her asd  .   I still advise  she take aspirin to prevent possible tia stroke at this time.

## 2012-12-11 NOTE — Telephone Encounter (Signed)
[facility] called to let us know that this pt, who was sched for an MRI today of brain, w/ & w/o, cancelled her appt. She cancelled because her portion to pay for the MRI was a little over $1000, and she refused to pay or be on a payment plan, stating, "then why do I have insurance?" Please advise.

## 2012-12-11 NOTE — Telephone Encounter (Signed)
I spoke to the pt and cancelled the appt.  Instructed her to go directly to ED if she felt like she was having a stroke or tia.  Cardiology could then get involved.  She declined to start ASA at this time.

## 2012-12-17 ENCOUNTER — Telehealth: Payer: Self-pay | Admitting: Internal Medicine

## 2013-01-14 ENCOUNTER — Ambulatory Visit: Payer: 59 | Admitting: Neurology

## 2013-02-27 ENCOUNTER — Telehealth: Payer: Self-pay | Admitting: Internal Medicine

## 2013-02-27 NOTE — Telephone Encounter (Signed)
Yes  cpx plus  Vit d  Dx vit d deficiency  Please  Send order for these .   Thanks

## 2013-02-27 NOTE — Telephone Encounter (Signed)
PT states that she would like to receive an RX for her CPX labs. She will take it to [facility]. Please assist.

## 2013-02-27 NOTE — Telephone Encounter (Signed)
Would you like to order a vitamin D along with CPE labs?

## 2013-02-28 NOTE — Telephone Encounter (Signed)
Patient notified to pick up at the front desk. 

## 2013-03-05 ENCOUNTER — Telehealth: Payer: Self-pay | Admitting: Family Medicine

## 2013-03-06 NOTE — Telephone Encounter (Signed)
Error

## 2013-06-06 ENCOUNTER — Other Ambulatory Visit: Payer: Self-pay

## 2013-06-24 ENCOUNTER — Encounter: Payer: Self-pay | Admitting: Internal Medicine

## 2013-06-24 ENCOUNTER — Other Ambulatory Visit (HOSPITAL_COMMUNITY)
Admission: RE | Admit: 2013-06-24 | Discharge: 2013-06-24 | Disposition: A | Discharge status: Home / Self Care | Payer: 59 | Source: Ambulatory Visit | Attending: Internal Medicine | Admitting: Internal Medicine

## 2013-06-24 ENCOUNTER — Ambulatory Visit (INDEPENDENT_AMBULATORY_CARE_PROVIDER_SITE_OTHER): Payer: 59 | Admitting: Internal Medicine

## 2013-06-24 VITALS — BP 94/74 | HR 81 | Temp 97.9°F | Ht 64.5 in | Wt 132.0 lb

## 2013-06-24 DIAGNOSIS — Q211 Atrial septal defect: Secondary | ICD-10-CM

## 2013-06-24 DIAGNOSIS — Z0000 Encounter for general adult medical examination without abnormal findings: Secondary | ICD-10-CM

## 2013-06-24 DIAGNOSIS — N912 Amenorrhea, unspecified: Secondary | ICD-10-CM

## 2013-06-24 DIAGNOSIS — E559 Vitamin D deficiency, unspecified: Secondary | ICD-10-CM

## 2013-06-24 DIAGNOSIS — Z01419 Encounter for gynecological examination (general) (routine) without abnormal findings: Secondary | ICD-10-CM

## 2013-06-24 DIAGNOSIS — Z2821 Immunization not carried out because of patient refusal: Secondary | ICD-10-CM

## 2013-06-24 DIAGNOSIS — N951 Menopausal and female climacteric states: Secondary | ICD-10-CM

## 2013-06-24 DIAGNOSIS — N911 Secondary amenorrhea: Secondary | ICD-10-CM

## 2013-06-24 DIAGNOSIS — Z1151 Encounter for screening for human papillomavirus (HPV): Secondary | ICD-10-CM | POA: Insufficient documentation

## 2013-06-24 NOTE — Patient Instructions (Signed)
Let she know when labs are back your vitamin D has been low in the past we'll check a perimenopausal hormone level to see if this is menopause. Consider getting a mammogram because of her mom's history of breast cancer. However there can be false positives. Your exam is normal except for your cardiac exam as you well mouth. The echo test from 6 months ago continues to show some mild elevation of her pulmonary pressures you at risk of getting high blood pressure in the lungs and once that eventually irreversable.  Even though you do not want her to surgery I still would like cardiology to be involved with your care and you can pick the cardiologist of your choice.  Contact you when your Pap smear results are back.

## 2013-06-24 NOTE — Progress Notes (Signed)
Chief Complaint  Patient presents with  . Annual Exam    HPI: Patient comes in today for Preventive Health Care visit  No major change in health status since last visit . Denies further spells cp sob syncope cough .   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 NO periods for a year.  Gets hot at times  HHof 4  Declines flu vaccines  Has hx of low vit level.  Beliefs and faith that nothing will happen that god doesn't want to happen.   ROS:  GEN/ HEENT: No fever, significant weight changes sweats headaches vision problems hearing changes, CV/ PULM; No chest pain shortness of breath cough, syncope,edema  change in exercise tolerance. GI /GU: No adominal pain, vomiting, change in bowel habits. No blood in the stool. No significant GU symptoms. SKIN/HEME: ,no acute skin rashes suspicious lesions or bleeding. No lymphadenopathy, nodules, masses.  NEURO/ PSYCH:  No neurologic signs such as weakness numbness. No depression anxiety. IMM/ Allergy: No unusual infections.  Allergy .   REST of 12 system review negative except as per HPI   Past Medical History  Diagnosis Date  . Abnormal Pap smear of cervix     rx cryo remote  has been normal  . Migraine   . Atrial septal defect     large echo 2008 and 2006  declines any surgical correction for belief reasons consutlts with [facility] . pt aware of risk of irreverible Pulm ht causind devility and death is possible without correction. declines surgery and is not a candidate for other procedures.   . Murmur   . Syncopal episodes   . Hx of abnormal cervical Pap smear     Family History  Problem Relation Age of Onset  . Breast cancer Mother   . Heart disease    . Other      step sister "hole in the heart"    History   Social History  . Marital Status: Married    Spouse Name: N/A    Number of Children: N/A  . Years of Education: N/A   Social History Main Topics  . Smoking status:  Never Smoker   . Smokeless tobacco: None  . Alcohol Use: No  . Drug Use: No  . Sexual Activity: None   Other Topics Concern  . 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    EXAM:  BP 94/74  Pulse 81  Temp(Src) 97.9 F (36.6 C) (Oral)  Ht 5' 4.5" (1.638 m)  Wt 132 lb (59.875 kg)  BMI 22.32 kg/m2  SpO2 98%  Body mass index is 22.32 kg/(m^2).  Physical Exam: Vital signs reviewed [id number] is a well-developed well-nourished alert cooperative   female who appears her stated age in no acute distress.  HEENT: normocephalic atraumatic , Eyes: PERRL EOM's full, conjunctiva clear, Nares: paten,t no deformity discharge or tenderness., Ears: no deformity EAC's clear TMs with normal landmarks. Mouth: clear OP, no lesions, edema.  Moist mucous membranes. Dentition in adequate repair. NECK: supple without masses, thyromegaly or bruits.  CHEST/PULM:  Clear to auscultation and percussion breath sounds equal no wheeze , rales or rhonchi. No chest wall deformities or tenderness. CV: PMI is nondisplaced? No heaves   2-3/6 systoloc murmur   s2 split   Peripheral pulses are full without delay.No JVD .  Breast: normal by inspection . No dimpling, discharge, dsicrete masses, tenderness or discharge . ABDOMEN: Bowel sounds normal nontender  No guard or rebound, no hepato splenomegal no CVA tenderness.  No hernia. Extremtities:  No clubbing cyanosis or edema, no acute joint swelling or redness no focal atrophy NEURO:  Oriented x3, cranial nerves 3-12 appear to be intact, no obvious focal weakness,gait within normal limits no abnormal reflexes or asymmetrical SKIN: No  acute rashes normal turgor, color, no bruising or petechiae. PSYCH: Oriented, good eye contact, no obvious depression anxiety, cognition and judgment appear normal. LN: no cervical axillary inguinal adenopathy Pelvic: NL ext GU, labia clear without lesions or rash . Vagina no lesions .Cervix: clear  UTERUS: Neg CMT Adnexa:  clear no masses . PAP done Rectal no masses  Labs done  [facility]  To be scanned into   ASSESSMENT AND PLAN:  Discussed the following assessment and plan:  Visit for preventive health examination - Plan: PAP [[place]]  Amenorrhea, secondary - prob menopausal  check fsh with labs   Encounter for routine gynecological examination - Plan: PAP [[place]]  Ostium secundum type atrial septal defect - surgery has been advised;pt has declined; reviewed my concern for her future health. agrees to have cardiology follow also as I advise .  - Plan: Ambulatory referral to Cardiology  Peri-menopausal  Influenza vaccination declined by patient  Vitamin D deficiency - doesnt drink reg milk.  Will help reconnect with [facility]  Patient Care Team: Madelin Headings, MD as PCP - General Philippa Sicks as Referring Physician (Cardiology) Patient Instructions  Let she know when labs are back your vitamin D has been low in the past we'll check a perimenopausal hormone level to see if this is menopause. Consider getting a mammogram because of her mom's history of breast cancer. However there can be false positives. Your exam is normal except for your cardiac exam as you well mouth. The echo test from 6 months ago continues to show some mild elevation of her pulmonary pressures you at risk of getting high blood pressure in the lungs and once that eventually irreversable.  Even though you do not want her to surgery I still would like cardiology to be involved with your care and you can pick the cardiologist of your choice.  Contact you when your Pap smear results are  back.   Neta Mends. Chason Mciver M.D.  Health Maintenance  Topic Date Due  . Influenza Vaccine  04/01/2014  . Pap Smear  06/24/2016  . Tetanus/tdap  07/05/2021   Health Maintenance Review   Last echo 5 14  Study Conclusions  - Left ventricle: The cavity size was normal. Wall thickness was normal. Systolic function was normal. The estimated ejection fraction was in the range of 50% to 55%. Wall motion was normal; there were no regional wall motion abnormalities. Left ventricular diastolic function parameters were normal. - Right ventricle: The cavity size was mildly dilated. Systolic function was mildly reduced. - Right atrium: The atrium was mildly dilated. - Atrial septum: The septum bowed from right to left, consistent with increased right atrial pressure. - Pulmonary arteries: PA peak pressure: 32mm Hg (S). - Pericardium, extracardiac: A  trivial pericardial effusion was identified. Impressions:  - Probable large secundum ASD; suggest TEE to further assess. Transthoracic echocardiography. M-mode, complete 2D, spectral Doppler, and color Doppler. Height: Height: 162.6cm. Height: 64in. Weight: Weight: 60.3kg. Weight: 132.7lb. Body mass index: BMI: 22.8kg/m^2. Body surface area: BSA: 1.9m^2. Blood pressure: 96/74. Patient status: Outpatient. Location: [facility]

## 2013-06-25 ENCOUNTER — Encounter: Payer: 59 | Admitting: Internal Medicine

## 2013-06-28 ENCOUNTER — Encounter: Payer: 59 | Admitting: Internal Medicine

## 2013-06-30 ENCOUNTER — Encounter: Payer: Self-pay | Admitting: Internal Medicine

## 2013-06-30 DIAGNOSIS — N911 Secondary amenorrhea: Secondary | ICD-10-CM | POA: Insufficient documentation

## 2013-06-30 NOTE — Progress Notes (Signed)
Quick Note:  Tell patient PAP is normal. HPV normal ______

## 2013-07-01 ENCOUNTER — Encounter: Payer: Self-pay | Admitting: Family Medicine

## 2013-07-10 ENCOUNTER — Encounter: Payer: Self-pay | Admitting: Internal Medicine

## 2013-07-29 ENCOUNTER — Encounter: Payer: 59 | Admitting: Internal Medicine

## 2013-11-11 ENCOUNTER — Encounter: Payer: Self-pay | Admitting: Internal Medicine

## 2013-11-11 ENCOUNTER — Ambulatory Visit: Payer: 59 | Admitting: Physical Therapy

## 2013-11-11 ENCOUNTER — Ambulatory Visit (INDEPENDENT_AMBULATORY_CARE_PROVIDER_SITE_OTHER): Payer: 59 | Admitting: Internal Medicine

## 2013-11-11 VITALS — BP 100/70 | HR 73 | Temp 98.0°F | Ht 64.5 in | Wt 137.0 lb

## 2013-11-11 DIAGNOSIS — Q211 Atrial septal defect, unspecified: Secondary | ICD-10-CM

## 2013-11-11 DIAGNOSIS — Q2111 Secundum atrial septal defect: Secondary | ICD-10-CM

## 2013-11-11 DIAGNOSIS — IMO0002 Reserved for concepts with insufficient information to code with codable children: Secondary | ICD-10-CM

## 2013-11-11 DIAGNOSIS — I808 Phlebitis and thrombophlebitis of other sites: Secondary | ICD-10-CM | POA: Insufficient documentation
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
## 2013-11-11 NOTE — Assessment & Plan Note (Signed)
Cards had left the service and she couldn't go to appt planned  May need to reschedule for this . She says she did not NO show.
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
## 2014-03-06 ENCOUNTER — Telehealth: Payer: Self-pay | Admitting: Internal Medicine

## 2014-03-06 NOTE — Telephone Encounter (Signed)
Also vit d for vit d deficincy

## 2014-03-06 NOTE — Telephone Encounter (Signed)
Any other tests needed?

## 2014-03-06 NOTE — Telephone Encounter (Signed)
Pt has appt on 11/25 for cpx and needs cpx labs order to take to [facility] prior. Please call pt when ready for pick up

## 2014-03-07 NOTE — Telephone Encounter (Signed)
Patient notified to pick up at the front desk. 

## 2014-05-16 ENCOUNTER — Other Ambulatory Visit: Payer: Self-pay

## 2014-06-02 ENCOUNTER — Encounter: Payer: Self-pay | Admitting: Internal Medicine

## 2014-06-25 ENCOUNTER — Ambulatory Visit (INDEPENDENT_AMBULATORY_CARE_PROVIDER_SITE_OTHER): Payer: 59 | Admitting: Internal Medicine

## 2014-06-25 ENCOUNTER — Encounter: Payer: Self-pay | Admitting: Internal Medicine

## 2014-06-25 VITALS — BP 116/60 | Temp 97.8°F | Ht 65.0 in | Wt 150.0 lb

## 2014-06-25 DIAGNOSIS — Q211 Atrial septal defect, unspecified: Secondary | ICD-10-CM

## 2014-06-25 DIAGNOSIS — Z0000 Encounter for general adult medical examination without abnormal findings: Secondary | ICD-10-CM

## 2014-06-25 DIAGNOSIS — E28319 Asymptomatic premature menopause: Secondary | ICD-10-CM

## 2014-06-25 DIAGNOSIS — E559 Vitamin D deficiency, unspecified: Secondary | ICD-10-CM

## 2014-06-25 NOTE — Progress Notes (Signed)
Pre visit review using our clinic review tool, if applicable. No additional management support is needed unless otherwise documented below in the visit note.  Chief Complaint  Patient presents with  . Annual Exam    HPI: Patient  Beth Romero  48 y.o. comes in today for Preventive Health Care visit  No menses  45  Dec hot flushes .   Feels well but going to school and working [employer] Doesn't take  Medications  Including vitamin d  Here  Because better price on insurance  stil no interested in any heart intervention Denies cp sob change in exercise tolerance  Says hungry all the time since menopause and snacks a lot  Health Maintenance  Topic Date Due  . INFLUENZA VACCINE  03/01/2014  . PAP SMEAR  06/24/2016  . TETANUS/TDAP  07/05/2021   Health Maintenance Review LIFESTYLE:  Exercise:   Walking stairs  Tobacco/ETS: no  Alcohol:   no Sugar beverages: soda and juice  Mostly water  Sleep:   More  Now    Some nocturia .  6 hours  Drug use: no PAP: 2014 neg hpv  MAMMO:   ROS:  GEN/ HEENT: No fever, significant weight changes sweats headaches vision problems hearing changes, CV/ PULM; No chest pain shortness of breath cough, syncope,edema  change in exercise tolerance. GI /GU: No adominal pain, vomiting, change in bowel habits. No blood in the stool. No significant GU symptoms. SKIN/HEME: ,no acute skin rashes suspicious lesions or bleeding. No lymphadenopathy, nodules, masses.  NEURO/ PSYCH:  No neurologic signs such as weakness numbness. No depression anxiety. IMM/ Allergy: No unusual infections.  Allergy .   REST of 12 system review negative except as per HPI   Past Medical History  Diagnosis Date  . Abnormal Pap smear of cervix     rx cryo remote  has been normal  . Migraine   . Atrial septal defect     large echo 2008 and 2006  declines any surgical correction for belief reasons consutlts with cards [facility] . pt aware of risk of irreverible Pulm ht causind  devility and death is possible without correction. declines surgery and is not a candidate for other procedures.   . Murmur   . Syncopal episodes   . Hx of abnormal cervical Pap smear     Past Surgical History  Procedure Laterality Date  . US echocardiography      2008 and 2006  . Cardiac catheterization      Summer 2009 [facility] large ASD 2:1 shunt  . Hosp  11/11/11    M[facility] weakness r/o TIA neg eval  . Tubal ligation      Family History  Problem Relation Age of Onset  . Breast cancer Mother   . Heart disease    . Other      step sister "hole in the heart"    History   Social History  . Marital Status: Married    Spouse Name: N/A    Number of Children: N/A  . Years of Education: N/A   Social History Main Topics  . Smoking status: Never Smoker   . Smokeless tobacco: None  . Alcohol Use: No  . Drug Use: No  . Sexual Activity: None   Other Topics Concern  . 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    EXAM:  BP 116/60 mmHg  Temp(Src) 97.8 F (36.6 C) (Oral)  Ht 5\' 5"  (1.651 m)  Wt 150 lb (68.04 kg)  BMI 24.96 kg/m2  Body mass index is 24.96 kg/(m^2).  Physical Exam: Vital signs reviewed ONG:EXBMGEN:This is a well-developed well-nourished alert cooperative    who appearsr stated age in no acute distress.  HEENT: normocephalic atraumatic , Eyes: PERRL EOM's full, conjunctiva clear, Nares: paten,t no deformity discharge or tenderness., Ears: no deformity EAC's clear TMs with normal landmarks. Mouth: clear OP, no lesions, edema.  Moist mucous membranes. Dentition in adequate repair. NECK: supple without masses,  thyromegaly or bruits. CHEST/PULM:  Clear to auscultation and percussion breath sounds equal no wheeze , rales or rhonchi. No chest wall deformities or tenderness. CV: PMI is   Diffuse  Loud systolic  too and fro 4/6 inc pr split  Peripheral pulses are full without delay.No JVD  ocass premature beat.  Breast: normal by inspection . No dimpling, discharge, masses, tenderness or discharge . ABDOMEN: Bowel sounds normal nontender  No guard or rebound, no hepato splenomegal no CVA tenderness protuberant   Firm but dont think any masses    Tenderness .  Marland Kitchen. Extremtities:  No clubbing cyanosis or edema, no acute joint swelling or redness no focal atrophy NEURO:  Oriented x3, cranial nerves 3-12 appear to be intact, no obvious focal weakness,gait within normal limits no abnormal reflexes or asymmetrical SKIN: No acute rashes normal turgor, color, no bruising or petechiae. PSYCH: Oriented, good eye contact, no obvious depression anxiety, cognition and judgment appear normal. LN: no cervical axillary inguinal adenopathy Labs done via [facility] scanned in to  EHR all nl except low Vit d   9  Wt Readings from Last 3 Encounters:  06/25/14 150 lb (68.04 kg)  11/11/13 137 lb (62.143 kg)  06/24/13 132 lb (59.875 kg)    ASSESSMENT AND PLAN:  Discussed the following assessment and plan:  Visit for preventive health examination  Atrial septal defect - large  shunting pt aware surgery advised still declines    Vitamin D deficiency - reviewed  ho goven for vit d   bone health   Early menopause? - age 48  Counseled regarding healthy nutrition, exercise, sleep, injury prevention, calcium vit d and healthy weight .  Patient Care Team: Madelin HeadingsWanda K Panosh, MD as PCP - General Philippa SicksGretchen Lois Wells, MD as Referring Physician (Cardiology) Patient Instructions  Increase vit d in diet  Otherwise labs ok   Healthy lifestyle includes : At least 150 minutes of exercise weeks  , weight at healthy levels, which is usually    BMI 19-25. Avoid trans fats and processed foods;  Increase fresh fruits and veges to 5 servings per day. And avoid sweet beverages including tea and juice. Mediterranean diet with olive oil and nuts have been noted to be heart and brain healthy . Avoid tobacco products . Limit  alcohol to  7 per week for women and 14 servings for men.  Get adequate sleep . Wear seat belts . Don't text and drive .    Your heart murmur  is still present   Realizing that when pressure in lungs gets to high over time  There  is no tr tr nno good treatment  And the heart can then fail against this pressure  This is called pulmonary hypertension.  Glad you are feeling well  . Contact us  if you change your mind about heart surgery.     Neta MendsWanda K. Panosh M.D.

## 2014-06-25 NOTE — Patient Instructions (Signed)
Increase vit d in diet  Otherwise labs ok   Healthy lifestyle includes : At least 150 minutes of exercise weeks  , weight at healthy levels, which is usually   BMI 19-25. Avoid trans fats and processed foods;  Increase fresh fruits and veges to 5 servings per day. And avoid sweet beverages including tea and juice. Mediterranean diet with olive oil and nuts have been noted to be heart and brain healthy . Avoid tobacco products . Limit  alcohol to  7 per week for women and 14 servings for men.  Get adequate sleep . Wear seat belts . Don't text and drive .    Your heart murmur  is still present   Realizing that when pressure in lungs gets to high over time  There is no tr tr nno good treatment  And the heart can then fail against this pressure  This is called pulmonary hypertension.  Glad you are feeling well  . Contact us  if you change your mind about heart surgery.

## 2014-07-11 ENCOUNTER — Encounter: Payer: Self-pay | Admitting: Internal Medicine

## 2015-01-21 ENCOUNTER — Telehealth: Payer: Self-pay | Admitting: Internal Medicine

## 2015-01-21 NOTE — Telephone Encounter (Signed)
Pt notified that order will be sent by mail.

## 2015-01-21 NOTE — Telephone Encounter (Signed)
Pt would like a order to have her labs drawn at [employer]. Pt asked if it can be mail to her if not she will pick up .

## 2015-06-30 ENCOUNTER — Encounter: Payer: Self-pay | Admitting: Internal Medicine

## 2015-06-30 ENCOUNTER — Ambulatory Visit (INDEPENDENT_AMBULATORY_CARE_PROVIDER_SITE_OTHER): Payer: 59 | Admitting: Internal Medicine

## 2015-06-30 VITALS — BP 106/76 | Temp 98.1°F | Ht 64.0 in | Wt 153.5 lb

## 2015-06-30 DIAGNOSIS — Q211 Atrial septal defect, unspecified: Secondary | ICD-10-CM

## 2015-06-30 DIAGNOSIS — Z0000 Encounter for general adult medical examination without abnormal findings: Secondary | ICD-10-CM | POA: Diagnosis not present

## 2015-06-30 NOTE — Assessment & Plan Note (Signed)
utd on pap  Dues 2019  Will get mammo at 52 SDM

## 2015-06-30 NOTE — Progress Notes (Signed)
Pre visit review using our clinic review tool, if applicable. No additional management support is needed unless otherwise documented below in the visit note.  Chief Complaint  Patient presents with  . Annual Exam    HPI: Patient  Beth Romero  49 y.o. comes in today for Preventive Health Care visit   Health Maintenance  Topic Date Due  . INFLUENZA VACCINE  12/30/2015 (Originally 03/02/2015)  . HIV Screening  06/29/2016 (Originally 05/07/1981)  . PAP SMEAR  06/24/2016  . TETANUS/TDAP  07/05/2021   Health Maintenance Review LIFESTYLE:  Exercise:   Tobacco/ETS: Alcohol: per day  Sugar beverages: Sleep: Drug use: no :PAP neg hr hpv in11  2014   ROS:  GEN/ HEENT: No fever, significant weight changes sweats headaches vision problems hearing changes, CV/ PULM; No chest pain shortness of breath cough, syncope,edema  change in exercise tolerance. GI /GU: No adominal pain, vomiting, change in bowel habits. No blood in the stool. No significant GU symptoms. SKIN/HEME: ,no acute skin rashes suspicious lesions or bleeding. No lymphadenopathy, nodules, masses.  NEURO/ PSYCH:  No neurologic signs such as weakness numbness. No depression anxiety. IMM/ Allergy: No unusual infections.  Allergy .   REST of 12 system review negative except as per HPI   Past Medical History  Diagnosis Date  . Abnormal Pap smear of cervix     rx cryo remote  has been normal  . Migraine   . Atrial septal defect     large echo 2008 and 2006  declines any surgical correction for belief reasons consutlts with [facility] . pt aware of risk of irreverible Pulm ht causind devility and death is possible without correction. declines surgery and is not a candidate for other procedures.   . Murmur   . Syncopal episodes   . Hx of abnormal cervical Pap smear     Past Surgical History  Procedure Laterality Date  . US echocardiography      2008 and 2006  . Cardiac catheterization      Summer 2009  [facility] large ASD 2:1 shunt  . Hosp  11/11/11    [facility] weakness r/o TIA neg eval  . Tubal ligation      Family History  Problem Relation Age of Onset  . Breast cancer Mother   . Heart disease    . Other      step sister "hole in the heart"    Social History   Social History  . Marital Status: Married    Spouse Name: N/A  . Number of Children: N/A  . Years of Education: N/A   Social History Main Topics  . Smoking status: Never Smoker   . Smokeless tobacco: None  . Alcohol Use: No  . Drug Use: No  . Sexual Activity: Not Asked   Other Topics Concern  . 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   No facility-administered medications prior to visit.     EXAM:  BP 106/76 mmHg  Temp(Src) 98.1 F (36.7 C) (  Oral)  Ht '5\' 4"'  (1.626 m)  Wt 153 lb 8 oz (69.627 kg)  BMI 26.34 kg/m2  Body mass index is 26.34 kg/(m^2).  Physical Exam: Vital signs reviewed TIR:WERX is a well-developed well-nourished alert cooperative    who appearsr stated age in no acute distress.  HEENT: normocephalic atraumatic , Eyes: PERRL EOM's full, conjunctiva clear, Nares: paten,t no deformity discharge or tenderness., Ears: no deformity EAC's clear TMs with normal landmarks. Mouth: clear OP, no lesions, edema.  Moist mucous membranes. Dentition in adequate repair. NECK: supple without masses, thyromegaly or bruits. CHEST/PULM:  Clear to auscultation and percussion breath sounds equal no wheeze , rales or rhonchi. No chest wall deformities or tenderness. CV: PMI is nondisplaced, ?S1 S2 hard to hear  Long systolic  murmur 5/4MGQ not in neck  no gallops,  Quiet precordium , rubs. Peripheral pulses are full without delay.No JVD .  Breast: normal by inspection . No dimpling, discharge, masses, tenderness or discharge . ABDOMEN: Bowel sounds normal nontender  No guard or rebound, no hepato splenomegal no CVA tenderness.  Extremtities:  No clubbing cyanosis or edema, no acute joint swelling or redness no focal atrophy NEURO:  Oriented x3, cranial nerves 3-12 appear to be intact, no obvious focal weakness,gait within normal limits no abnormal reflexes or asymmetrical SKIN: No acute rashes normal turgor, color, no bruising or petechiae. PSYCH: Oriented, good eye contact, no obvious depression anxiety, cognition and judgment appear normal. LN: no cervical axillary inguinal adenopathy  Labs from lab cord reviewed nl  excwept ldl 112 good ratio   ASSESSMENT AND PLAN:  Discussed the following assessment and plan:  Visit for preventive health examination  Atrial septal defect  Patient Care Team: Burnis Medin, MD as PCP - General Jolaine Artist, MD as Referring Physician (Cardiology) Patient Instructions  Get as much sleep as possible  Limit the cookies.  Plan looking at getting repet echo next year .    Health Maintenance, Female Adopting a healthy lifestyle and getting preventive care can go a long way to promote health and wellness. Talk with your health care provider about what schedule of regular examinations is right for you. This is a good chance for you to check in with your provider about disease prevention and staying healthy. In between checkups, there are plenty of things you can do on your own. Experts have done a lot of research about which lifestyle changes and preventive measures are most likely to keep you healthy. Ask your health care provider for more information. WEIGHT AND DIET  Eat a healthy diet  Be sure to include plenty of vegetables, fruits, low-fat dairy products, and lean  protein.  Do not eat a lot of foods high in solid fats, added sugars, or salt.  Get regular exercise. This is one of the most important things you can do for your health.  Most adults should exercise for at least 150 minutes each week. The exercise should increase your heart rate and make you sweat (moderate-intensity exercise).  Most adults should also do strengthening exercises at least twice a week. This is in addition to the moderate-intensity exercise.  Maintain a healthy weight  Body mass index (BMI) is a measurement that can be used to identify possible weight problems. It estimates body fat based on height and weight. Your health care provider can help determine your BMI and help you achieve or maintain a healthy weight.  For females 62 years of age and older:   A  BMI below 18.5 is considered underweight.  A BMI of 18.5 to 24.9 is normal.  A BMI of 25 to 29.9 is considered overweight.  A BMI of 30 and above is considered obese.  Watch levels of cholesterol and blood lipids  You should start having your blood tested for lipids and cholesterol at 47 years of age, then have this test every 5 years.  You may need to have your cholesterol levels checked more often if:  Your lipid or cholesterol levels are high.  You are older than 46 years of age.  You are at high risk for heart disease.  CANCER SCREENING   Lung Cancer  Lung cancer screening is recommended for adults 8-6 years old who are at high risk for lung cancer because of a history of smoking.  A yearly low-dose CT scan of the lungs is recommended for people who:  Currently smoke.  Have quit within the past 15 years.  Have at least a 30-pack-year history of smoking. A pack year is smoking an average of one pack of cigarettes a day for 1 year.  Yearly screening should continue until it has been 15 years since you quit.  Yearly screening should stop if you develop a health problem that would prevent you  from having lung cancer treatment.  Breast Cancer  Practice breast self-awareness. This means understanding how your breasts normally appear and feel.  It also means doing regular breast self-exams. Let your health care provider know about any changes, no matter how small.  If you are in your 20s or 30s, you should have a clinical breast exam (CBE) by a health care provider every 1-3 years as part of a regular health exam.  If you are 30 or older, have a CBE every year. Also consider having a breast X-ray (mammogram) every year.  If you have a family history of breast cancer, talk to your health care provider about genetic screening.  If you are at high risk for breast cancer, talk to your health care provider about having an MRI and a mammogram every year.  Breast cancer gene (BRCA) assessment is recommended for women who have family members with BRCA-related cancers. BRCA-related cancers include:  Breast.  Ovarian.  Tubal.  Peritoneal cancers.  Results of the assessment will determine the need for genetic counseling and BRCA1 and BRCA2 testing. Cervical Cancer Your health care provider may recommend that you be screened regularly for cancer of the pelvic organs (ovaries, uterus, and vagina). This screening involves a pelvic examination, including checking for microscopic changes to the surface of your cervix (Pap test). You may be encouraged to have this screening done every 3 years, beginning at age 13.  For women ages 16-65, health care providers may recommend pelvic exams and Pap testing every 3 years, or they may recommend the Pap and pelvic exam, combined with testing for human papilloma virus (HPV), every 5 years. Some types of HPV increase your risk of cervical cancer. Testing for HPV may also be done on women of any age with unclear Pap test results.  Other health care providers may not recommend any screening for nonpregnant women who are considered low risk for pelvic  cancer and who do not have symptoms. Ask your health care provider if a screening pelvic exam is right for you.  If you have had past treatment for cervical cancer or a condition that could lead to cancer, you need Pap tests and screening for cancer for at least 20  years after your treatment. If Pap tests have been discontinued, your risk factors (such as having a new sexual partner) need to be reassessed to determine if screening should resume. Some women have medical problems that increase the chance of getting cervical cancer. In these cases, your health care provider may recommend more frequent screening and Pap tests. Colorectal Cancer  This type of cancer can be detected and often prevented.  Routine colorectal cancer screening usually begins at 47 years of age and continues through 47 years of age.  Your health care provider may recommend screening at an earlier age if you have risk factors for colon cancer.  Your health care provider may also recommend using home test kits to check for hidden blood in the stool.  A small camera at the end of a tube can be used to examine your colon directly (sigmoidoscopy or colonoscopy). This is done to check for the earliest forms of colorectal cancer.  Routine screening usually begins at age 35.  Direct examination of the colon should be repeated every 5-10 years through 47 years of age. However, you may need to be screened more often if early forms of precancerous polyps or small growths are found. Skin Cancer  Check your skin from head to toe regularly.  Tell your health care provider about any new moles or changes in moles, especially if there is a change in a mole's shape or color.  Also tell your health care provider if you have a mole that is larger than the size of a pencil eraser.  Always use sunscreen. Apply sunscreen liberally and repeatedly throughout the day.  Protect yourself by wearing long sleeves, pants, a wide-brimmed hat, and  sunglasses whenever you are outside. HEART DISEASE, DIABETES, AND HIGH BLOOD PRESSURE   High blood pressure causes heart disease and increases the risk of stroke. High blood pressure is more likely to develop in:  People who have blood pressure in the high end of the normal range (130-139/85-89 mm Hg).  People who are overweight or obese.  People who are African American.  If you are 49-31 years of age, have your blood pressure checked every 3-5 years. If you are 59 years of age or older, have your blood pressure checked every year. You should have your blood pressure measured twice--once when you are at a hospital or clinic, and once when you are not at a hospital or clinic. Record the average of the two measurements. To check your blood pressure when you are not at a hospital or clinic, you can use:  An automated blood pressure machine at a pharmacy.  A home blood pressure monitor.  If you are between 12 years and 82 years old, ask your health care provider if you should take aspirin to prevent strokes.  Have regular diabetes screenings. This involves taking a blood sample to check your fasting blood sugar level.  If you are at a normal weight and have a low risk for diabetes, have this test once every three years after 47 years of age.  If you are overweight and have a high risk for diabetes, consider being tested at a younger age or more often. PREVENTING INFECTION  Hepatitis B  If you have a higher risk for hepatitis B, you should be screened for this virus. You are considered at high risk for hepatitis B if:  You were born in a country where hepatitis B is common. Ask your health care provider which countries are considered high  risk.  Your parents were born in a high-risk country, and you have not been immunized against hepatitis B (hepatitis B vaccine).  You have HIV or AIDS.  You use needles to inject street drugs.  You live with someone who has hepatitis B.  You have  had sex with someone who has hepatitis B.  You get hemodialysis treatment.  You take certain medicines for conditions, including cancer, organ transplantation, and autoimmune conditions. Hepatitis C  Blood testing is recommended for:  Everyone born from [birth year] through 1963.  Anyone with known risk factors for hepatitis C. Sexually transmitted infections (STIs)  You should be screened for sexually transmitted infections (STIs) including gonorrhea and chlamydia if:  You are sexually active and are younger than 47 years of age.  You are older than 47 years of age and your health care provider tells you that you are at risk for this type of infection.  Your sexual activity has changed since you were last screened and you are at an increased risk for chlamydia or gonorrhea. Ask your health care provider if you are at risk.  If you do not have HIV, but are at risk, it may be recommended that you take a prescription medicine daily to prevent HIV infection. This is called pre-exposure prophylaxis (PrEP). You are considered at risk if:  You are sexually active and do not regularly use condoms or know the HIV status of your partner(s).  You take drugs by injection.  You are sexually active with a partner who has HIV. Talk with your health care provider about whether you are at high risk of being infected with HIV. If you choose to begin PrEP, you should first be tested for HIV. You should then be tested every 3 months for as long as you are taking PrEP.  PREGNANCY   If you are premenopausal and you may become pregnant, ask your health care provider about preconception counseling.  If you may become pregnant, take 400 to 800 micrograms (mcg) of folic acid every day.  If you want to prevent pregnancy, talk to your health care provider about birth control (contraception). OSTEOPOROSIS AND MENOPAUSE   Osteoporosis is a disease in which the bones lose minerals and strength with aging. This can  result in serious bone fractures. Your risk for osteoporosis can be identified using a bone density scan.  If you are 58 years of age or older, or if you are at risk for osteoporosis and fractures, ask your health care provider if you should be screened.  Ask your health care provider whether you should take a calcium or vitamin D supplement to lower your risk for osteoporosis.  Menopause may have certain physical symptoms and risks.  Hormone replacement therapy may reduce some of these symptoms and risks. Talk to your health care provider about whether hormone replacement therapy is right for you.  HOME CARE INSTRUCTIONS   Schedule regular health, dental, and eye exams.  Stay current with your immunizations.   Do not use any tobacco products including cigarettes, chewing tobacco, or electronic cigarettes.  If you are pregnant, do not drink alcohol.  If you are breastfeeding, limit how much and how often you drink alcohol.  Limit alcohol intake to no more than 1 drink per day for nonpregnant women. One drink equals 12 ounces of beer, 5 ounces of wine, or 1 ounces of hard liquor.  Do not use street drugs.  Do not share needles.  Ask your health care provider for  help if you need support or information about quitting drugs.  Tell your health care provider if you often feel depressed.  Tell your health care provider if you have ever been abused or do not feel safe at home.   This information is not intended to replace advice given to you by your health care provider. Make sure you discuss any questions you have with your health care provider.   Document Released: 01/31/2011 Document Revised: 08/08/2014 Document Reviewed: 06/19/2013 Elsevier Interactive Patient Education 2016 Sabinal K. Zamani Crocker M.D.

## 2015-06-30 NOTE — Patient Instructions (Addendum)
Get as much sleep as possible  Limit the cookies.  Plan looking at getting repet echo next year .    Health Maintenance, Female Adopting a healthy lifestyle and getting preventive care can go a long way to promote health and wellness. Talk with your health care provider about what schedule of regular examinations is right for you. This is a good chance for you to check in with your provider about disease prevention and staying healthy. In between checkups, there are plenty of things you can do on your own. Experts have done a lot of research about which lifestyle changes and preventive measures are most likely to keep you healthy. Ask your health care provider for more information. WEIGHT AND DIET  Eat a healthy diet  Be sure to include plenty of vegetables, fruits, low-fat dairy products, and lean protein.  Do not eat a lot of foods high in solid fats, added sugars, or salt.  Get regular exercise. This is one of the most important things you can do for your health.  Most adults should exercise for at least 150 minutes each week. The exercise should increase your heart rate and make you sweat (moderate-intensity exercise).  Most adults should also do strengthening exercises at least twice a week. This is in addition to the moderate-intensity exercise.  Maintain a healthy weight  Body mass index (BMI) is a measurement that can be used to identify possible weight problems. It estimates body fat based on height and weight. Your health care provider can help determine your BMI and help you achieve or maintain a healthy weight.  For females 29 years of age and older:   A BMI below 18.5 is considered underweight.  A BMI of 18.5 to 24.9 is normal.  A BMI of 25 to 29.9 is considered overweight.  A BMI of 30 and above is considered obese.  Watch levels of cholesterol and blood lipids  You should start having your blood tested for lipids and cholesterol at 47 years of age, then have this  test every 5 years.  You may need to have your cholesterol levels checked more often if:  Your lipid or cholesterol levels are high.  You are older than 47 years of age.  You are at high risk for heart disease.  CANCER SCREENING   Lung Cancer  Lung cancer screening is recommended for adults 53-62 years old who are at high risk for lung cancer because of a history of smoking.  A yearly low-dose CT scan of the lungs is recommended for people who:  Currently smoke.  Have quit within the past 15 years.  Have at least a 30-pack-year history of smoking. A pack year is smoking an average of one pack of cigarettes a day for 1 year.  Yearly screening should continue until it has been 15 years since you quit.  Yearly screening should stop if you develop a health problem that would prevent you from having lung cancer treatment.  Breast Cancer  Practice breast self-awareness. This means understanding how your breasts normally appear and feel.  It also means doing regular breast self-exams. Let your health care provider know about any changes, no matter how small.  If you are in your 20s or 30s, you should have a clinical breast exam (CBE) by a health care provider every 1-3 years as part of a regular health exam.  If you are 3 or older, have a CBE every year. Also consider having a breast X-ray (mammogram) every  year.  If you have a family history of breast cancer, talk to your health care provider about genetic screening.  If you are at high risk for breast cancer, talk to your health care provider about having an MRI and a mammogram every year.  Breast cancer gene (BRCA) assessment is recommended for women who have family members with BRCA-related cancers. BRCA-related cancers include:  Breast.  Ovarian.  Tubal.  Peritoneal cancers.  Results of the assessment will determine the need for genetic counseling and BRCA1 and BRCA2 testing. Cervical Cancer Your health care  provider may recommend that you be screened regularly for cancer of the pelvic organs (ovaries, uterus, and vagina). This screening involves a pelvic examination, including checking for microscopic changes to the surface of your cervix (Pap test). You may be encouraged to have this screening done every 3 years, beginning at age 58.  For women ages 14-65, health care providers may recommend pelvic exams and Pap testing every 3 years, or they may recommend the Pap and pelvic exam, combined with testing for human papilloma virus (HPV), every 5 years. Some types of HPV increase your risk of cervical cancer. Testing for HPV may also be done on women of any age with unclear Pap test results.  Other health care providers may not recommend any screening for nonpregnant women who are considered low risk for pelvic cancer and who do not have symptoms. Ask your health care provider if a screening pelvic exam is right for you.  If you have had past treatment for cervical cancer or a condition that could lead to cancer, you need Pap tests and screening for cancer for at least 20 years after your treatment. If Pap tests have been discontinued, your risk factors (such as having a new sexual partner) need to be reassessed to determine if screening should resume. Some women have medical problems that increase the chance of getting cervical cancer. In these cases, your health care provider may recommend more frequent screening and Pap tests. Colorectal Cancer  This type of cancer can be detected and often prevented.  Routine colorectal cancer screening usually begins at 47 years of age and continues through 47 years of age.  Your health care provider may recommend screening at an earlier age if you have risk factors for colon cancer.  Your health care provider may also recommend using home test kits to check for hidden blood in the stool.  A small camera at the end of a tube can be used to examine your colon directly  (sigmoidoscopy or colonoscopy). This is done to check for the earliest forms of colorectal cancer.  Routine screening usually begins at age 68.  Direct examination of the colon should be repeated every 5-10 years through 47 years of age. However, you may need to be screened more often if early forms of precancerous polyps or small growths are found. Skin Cancer  Check your skin from head to toe regularly.  Tell your health care provider about any new moles or changes in moles, especially if there is a change in a mole's shape or color.  Also tell your health care provider if you have a mole that is larger than the size of a pencil eraser.  Always use sunscreen. Apply sunscreen liberally and repeatedly throughout the day.  Protect yourself by wearing long sleeves, pants, a wide-brimmed hat, and sunglasses whenever you are outside. HEART DISEASE, DIABETES, AND HIGH BLOOD PRESSURE   High blood pressure causes heart disease and increases  the risk of stroke. High blood pressure is more likely to develop in:  People who have blood pressure in the high end of the normal range (130-139/85-89 mm Hg).  People who are overweight or obese.  People who are African American.  If you are 45-69 years of age, have your blood pressure checked every 3-5 years. If you are 72 years of age or older, have your blood pressure checked every year. You should have your blood pressure measured twice--once when you are at a hospital or clinic, and once when you are not at a hospital or clinic. Record the average of the two measurements. To check your blood pressure when you are not at a hospital or clinic, you can use:  An automated blood pressure machine at a pharmacy.  A home blood pressure monitor.  If you are between 88 years and 45 years old, ask your health care provider if you should take aspirin to prevent strokes.  Have regular diabetes screenings. This involves taking a blood sample to check your  fasting blood sugar level.  If you are at a normal weight and have a low risk for diabetes, have this test once every three years after 47 years of age.  If you are overweight and have a high risk for diabetes, consider being tested at a younger age or more often. PREVENTING INFECTION  Hepatitis B  If you have a higher risk for hepatitis B, you should be screened for this virus. You are considered at high risk for hepatitis B if:  You were born in a country where hepatitis B is common. Ask your health care provider which countries are considered high risk.  Your parents were born in a high-risk country, and you have not been immunized against hepatitis B (hepatitis B vaccine).  You have HIV or AIDS.  You use needles to inject street drugs.  You live with someone who has hepatitis B.  You have had sex with someone who has hepatitis B.  You get hemodialysis treatment.  You take certain medicines for conditions, including cancer, organ transplantation, and autoimmune conditions. Hepatitis C  Blood testing is recommended for:  Everyone born from [birth year] through 1963.  Anyone with known risk factors for hepatitis C. Sexually transmitted infections (STIs)  You should be screened for sexually transmitted infections (STIs) including gonorrhea and chlamydia if:  You are sexually active and are younger than 46 years of age.  You are older than 47 years of age and your health care provider tells you that you are at risk for this type of infection.  Your sexual activity has changed since you were last screened and you are at an increased risk for chlamydia or gonorrhea. Ask your health care provider if you are at risk.  If you do not have HIV, but are at risk, it may be recommended that you take a prescription medicine daily to prevent HIV infection. This is called pre-exposure prophylaxis (PrEP). You are considered at risk if:  You are sexually active and do not regularly use condoms or  know the HIV status of your partner(s).  You take drugs by injection.  You are sexually active with a partner who has HIV. Talk with your health care provider about whether you are at high risk of being infected with HIV. If you choose to begin PrEP, you should first be tested for HIV. You should then be tested every 3 months for as long as you are taking PrEP.  PREGNANCY  If you are premenopausal and you may become pregnant, ask your health care provider about preconception counseling.  If you may become pregnant, take 400 to 800 micrograms (mcg) of folic acid every day.  If you want to prevent pregnancy, talk to your health care provider about birth control (contraception). OSTEOPOROSIS AND MENOPAUSE   Osteoporosis is a disease in which the bones lose minerals and strength with aging. This can result in serious bone fractures. Your risk for osteoporosis can be identified using a bone density scan.  If you are 36 years of age or older, or if you are at risk for osteoporosis and fractures, ask your health care provider if you should be screened.  Ask your health care provider whether you should take a calcium or vitamin D supplement to lower your risk for osteoporosis.  Menopause may have certain physical symptoms and risks.  Hormone replacement therapy may reduce some of these symptoms and risks. Talk to your health care provider about whether hormone replacement therapy is right for you.  HOME CARE INSTRUCTIONS   Schedule regular health, dental, and eye exams.  Stay current with your immunizations.   Do not use any tobacco products including cigarettes, chewing tobacco, or electronic cigarettes.  If you are pregnant, do not drink alcohol.  If you are breastfeeding, limit how much and how often you drink alcohol.  Limit alcohol intake to no more than 1 drink per day for nonpregnant women. One drink equals 12 ounces of beer, 5 ounces of wine, or 1 ounces of hard liquor.  Do  not use street drugs.  Do not share needles.  Ask your health care provider for help if you need support or information about quitting drugs.  Tell your health care provider if you often feel depressed.  Tell your health care provider if you have ever been abused or do not feel safe at home.   This information is not intended to replace advice given to you by your health care provider. Make sure you discuss any questions you have with your health care provider.   Document Released: 01/31/2011 Document Revised: 08/08/2014 Document Reviewed: 06/19/2013 Elsevier Interactive Patient Education [employer].

## 2015-06-30 NOTE — Assessment & Plan Note (Signed)
Denies sx ;continues  to decline intervention murmur sounds the same  Cannot tell s2 .   Plan on repeating echo next year   Had some inc PAP last check 2012 38

## 2016-03-07 ENCOUNTER — Telehealth: Payer: Self-pay | Admitting: Internal Medicine

## 2016-03-07 NOTE — Telephone Encounter (Signed)
[facility]

## 2016-03-08 NOTE — Telephone Encounter (Signed)
Pt notified to pick up at the front desk. 

## 2016-05-16 ENCOUNTER — Encounter: Payer: Self-pay | Admitting: Internal Medicine

## 2016-05-18 ENCOUNTER — Encounter: Payer: Self-pay | Admitting: Internal Medicine

## 2016-05-18 ENCOUNTER — Ambulatory Visit (INDEPENDENT_AMBULATORY_CARE_PROVIDER_SITE_OTHER): Payer: 59 | Admitting: Internal Medicine

## 2016-05-18 VITALS — BP 102/80 | Temp 97.7°F | Ht 64.75 in | Wt 153.6 lb

## 2016-05-18 DIAGNOSIS — Q211 Atrial septal defect, unspecified: Secondary | ICD-10-CM

## 2016-05-18 DIAGNOSIS — Z0000 Encounter for general adult medical examination without abnormal findings: Secondary | ICD-10-CM | POA: Diagnosis not present

## 2016-05-18 NOTE — Patient Instructions (Addendum)
Continue lifestyle intervention healthy eating and exercise .  Advise repeat echo test in the next year   Pap due in 2018 or 2019 .  Will let you know when labs results are back.    Health Maintenance, Female Adopting a healthy lifestyle and getting preventive care can go a long way to promote health and wellness. Talk with your health care provider about what schedule of regular examinations is right for you. This is a good chance for you to check in with your provider about disease prevention and staying healthy. In between checkups, there are plenty of things you can do on your own. Experts have done a lot of research about which lifestyle changes and preventive measures are most likely to keep you healthy. Ask your health care provider for more information. WEIGHT AND DIET  Eat a healthy diet  Be sure to include plenty of vegetables, fruits, low-fat dairy products, and lean protein.  Do not eat a lot of foods high in solid fats, added sugars, or salt.  Get regular exercise. This is one of the most important things you can do for your health.  Most adults should exercise for at least 150 minutes each week. The exercise should increase your heart rate and make you sweat (moderate-intensity exercise).  Most adults should also do strengthening exercises at least twice a week. This is in addition to the moderate-intensity exercise.  Maintain a healthy weight  Body mass index (BMI) is a measurement that can be used to identify possible weight problems. It estimates body fat based on height and weight. Your health care provider can help determine your BMI and help you achieve or maintain a healthy weight.  For females 22 years of age and older:   A BMI below 18.5 is considered underweight.  A BMI of 18.5 to 24.9 is normal.  A BMI of 25 to 29.9 is considered overweight.  A BMI of 30 and above is considered obese.  Watch levels of cholesterol and blood lipids  You should start  having your blood tested for lipids and cholesterol at 48 years of age, then have this test every 5 years.  You may need to have your cholesterol levels checked more often if:  Your lipid or cholesterol levels are high.  You are older than 48 years of age.  You are at high risk for heart disease.  CANCER SCREENING   Lung Cancer  Lung cancer screening is recommended for adults 44-59 years old who are at high risk for lung cancer because of a history of smoking.  A yearly low-dose CT scan of the lungs is recommended for people who:  Currently smoke.  Have quit within the past 15 years.  Have at least a 30-pack-year history of smoking. A pack year is smoking an average of one pack of cigarettes a day for 1 year.  Yearly screening should continue until it has been 15 years since you quit.  Yearly screening should stop if you develop a health problem that would prevent you from having lung cancer treatment.  Breast Cancer  Practice breast self-awareness. This means understanding how your breasts normally appear and feel.  It also means doing regular breast self-exams. Let your health care provider know about any changes, no matter how small.  If you are in your 20s or 30s, you should have a clinical breast exam (CBE) by a health care provider every 1-3 years as part of a regular health exam.  If you are  46 or older, have a CBE every year. Also consider having a breast X-ray (mammogram) every year.  If you have a family history of breast cancer, talk to your health care provider about genetic screening.  If you are at high risk for breast cancer, talk to your health care provider about having an MRI and a mammogram every year.  Breast cancer gene (BRCA) assessment is recommended for women who have family members with BRCA-related cancers. BRCA-related cancers include:  Breast.  Ovarian.  Tubal.  Peritoneal cancers.  Results of the assessment will determine the need for  genetic counseling and BRCA1 and BRCA2 testing. Cervical Cancer Your health care provider may recommend that you be screened regularly for cancer of the pelvic organs (ovaries, uterus, and vagina). This screening involves a pelvic examination, including checking for microscopic changes to the surface of your cervix (Pap test). You may be encouraged to have this screening done every 3 years, beginning at age 33.  For women ages 66-65, health care providers may recommend pelvic exams and Pap testing every 3 years, or they may recommend the Pap and pelvic exam, combined with testing for human papilloma virus (HPV), every 5 years. Some types of HPV increase your risk of cervical cancer. Testing for HPV may also be done on women of any age with unclear Pap test results.  Other health care providers may not recommend any screening for nonpregnant women who are considered low risk for pelvic cancer and who do not have symptoms. Ask your health care provider if a screening pelvic exam is right for you.  If you have had past treatment for cervical cancer or a condition that could lead to cancer, you need Pap tests and screening for cancer for at least 20 years after your treatment. If Pap tests have been discontinued, your risk factors (such as having a new sexual partner) need to be reassessed to determine if screening should resume. Some women have medical problems that increase the chance of getting cervical cancer. In these cases, your health care provider may recommend more frequent screening and Pap tests. Colorectal Cancer  This type of cancer can be detected and often prevented.  Routine colorectal cancer screening usually begins at 48 years of age and continues through 48 years of age.  Your health care provider may recommend screening at an earlier age if you have risk factors for colon cancer.  Your health care provider may also recommend using home test kits to check for hidden blood in the  stool.  A small camera at the end of a tube can be used to examine your colon directly (sigmoidoscopy or colonoscopy). This is done to check for the earliest forms of colorectal cancer.  Routine screening usually begins at age 90.  Direct examination of the colon should be repeated every 5-10 years through 48 years of age. However, you may need to be screened more often if early forms of precancerous polyps or small growths are found. Skin Cancer  Check your skin from head to toe regularly.  Tell your health care provider about any new moles or changes in moles, especially if there is a change in a mole's shape or color.  Also tell your health care provider if you have a mole that is larger than the size of a pencil eraser.  Always use sunscreen. Apply sunscreen liberally and repeatedly throughout the day.  Protect yourself by wearing long sleeves, pants, a wide-brimmed hat, and sunglasses whenever you are outside. HEART  DISEASE, DIABETES, AND HIGH BLOOD PRESSURE   High blood pressure causes heart disease and increases the risk of stroke. High blood pressure is more likely to develop in:  People who have blood pressure in the high end of the normal range (130-139/85-89 mm Hg).  People who are overweight or obese.  People who are African American.  If you are 69-71 years of age, have your blood pressure checked every 3-5 years. If you are 47 years of age or older, have your blood pressure checked every year. You should have your blood pressure measured twice--once when you are at a hospital or clinic, and once when you are not at a hospital or clinic. Record the average of the two measurements. To check your blood pressure when you are not at a hospital or clinic, you can use:  An automated blood pressure machine at a pharmacy.  A home blood pressure monitor.  If you are between 25 years and 92 years old, ask your health care provider if you should take aspirin to prevent  strokes.  Have regular diabetes screenings. This involves taking a blood sample to check your fasting blood sugar level.  If you are at a normal weight and have a low risk for diabetes, have this test once every three years after 48 years of age.  If you are overweight and have a high risk for diabetes, consider being tested at a younger age or more often. PREVENTING INFECTION  Hepatitis B  If you have a higher risk for hepatitis B, you should be screened for this virus. You are considered at high risk for hepatitis B if:  You were born in a country where hepatitis B is common. Ask your health care provider which countries are considered high risk.  Your parents were born in a high-risk country, and you have not been immunized against hepatitis B (hepatitis B vaccine).  You have HIV or AIDS.  You use needles to inject street drugs.  You live with someone who has hepatitis B.  You have had sex with someone who has hepatitis B.  You get hemodialysis treatment.  You take certain medicines for conditions, including cancer, organ transplantation, and autoimmune conditions. Hepatitis C  Blood testing is recommended for:  Everyone born from [birth year] through 1963.  Anyone with known risk factors for hepatitis C. Sexually transmitted infections (STIs)  You should be screened for sexually transmitted infections (STIs) including gonorrhea and chlamydia if:  You are sexually active and are younger than 48 years of age.  You are older than 48 years of age and your health care provider tells you that you are at risk for this type of infection.  Your sexual activity has changed since you were last screened and you are at an increased risk for chlamydia or gonorrhea. Ask your health care provider if you are at risk.  If you do not have HIV, but are at risk, it may be recommended that you take a prescription medicine daily to prevent HIV infection. This is called pre-exposure prophylaxis  (PrEP). You are considered at risk if:  You are sexually active and do not regularly use condoms or know the HIV status of your partner(s).  You take drugs by injection.  You are sexually active with a partner who has HIV. Talk with your health care provider about whether you are at high risk of being infected with HIV. If you choose to begin PrEP, you should first be tested for HIV. You should  then be tested every 3 months for as long as you are taking PrEP.  PREGNANCY   If you are premenopausal and you may become pregnant, ask your health care provider about preconception counseling.  If you may become pregnant, take 400 to 800 micrograms (mcg) of folic acid every day.  If you want to prevent pregnancy, talk to your health care provider about birth control (contraception). OSTEOPOROSIS AND MENOPAUSE   Osteoporosis is a disease in which the bones lose minerals and strength with aging. This can result in serious bone fractures. Your risk for osteoporosis can be identified using a bone density scan.  If you are 32 years of age or older, or if you are at risk for osteoporosis and fractures, ask your health care provider if you should be screened.  Ask your health care provider whether you should take a calcium or vitamin D supplement to lower your risk for osteoporosis.  Menopause may have certain physical symptoms and risks.  Hormone replacement therapy may reduce some of these symptoms and risks. Talk to your health care provider about whether hormone replacement therapy is right for you.  HOME CARE INSTRUCTIONS   Schedule regular health, dental, and eye exams.  Stay current with your immunizations.   Do not use any tobacco products including cigarettes, chewing tobacco, or electronic cigarettes.  If you are pregnant, do not drink alcohol.  If you are breastfeeding, limit how much and how often you drink alcohol.  Limit alcohol intake to no more than 1 drink per day for  nonpregnant women. One drink equals 12 ounces of beer, 5 ounces of wine, or 1 ounces of hard liquor.  Do not use street drugs.  Do not share needles.  Ask your health care provider for help if you need support or information about quitting drugs.  Tell your health care provider if you often feel depressed.  Tell your health care provider if you have ever been abused or do not feel safe at home.   This information is not intended to replace advice given to you by your health care provider. Make sure you discuss any questions you have with your health care provider.   Document Released: 01/31/2011 Document Revised: 08/08/2014 Document Reviewed: 06/19/2013 [employer] Interactive Patient Education [employer].

## 2016-05-18 NOTE — Progress Notes (Signed)
Pre visit review using our clinic review tool, if applicable. No additional management support is needed unless otherwise documented below in the visit note.  Chief Complaint  Patient presents with  . Annual Exam    HPI: Patient  Beth Romero  50 y.o. comes in today for [place] visit  Lab done and to be send from [facility] .   Feels fine  except toe nail r 5th has  Ingrown?   Better pulled off  . Denies cp sob syncope  Working from  h ome  Child had hip fracture re [facility] etc   cannot weight bear . No periods   Hot flushes better   Health Maintenance  Topic Date Due  . INFLUENZA VACCINE  12/29/2016 (Originally 03/01/2016)  . HIV Screening  05/17/2017 (Originally 05/07/1981)  . PAP SMEAR  06/24/2016  . TETANUS/TDAP  07/05/2021   Health Maintenance Review LIFESTYLE:  Exercise:   Need to start. Tobacco/ETS:no Alcohol:  no Sugar beverages: water Sleep:  Depends   about 7 hours   Drug use: no HH of   3  No pets  Work:  40  From home .Marland Kitchen    Cooking  Free .  Single ingredients.     ROS:  GEN/ HEENT: No fever, significant weight changes sweats headaches vision problems hearing changes, CV/ PULM; No chest pain shortness of breath cough, syncope,edema  change in exercise tolerance. GI /GU: No adominal pain, vomiting, change in bowel habits. No blood in the stool. No significant GU symptoms. SKIN/HEME: ,no acute skin rashes suspicious lesions or bleeding. No lymphadenopathy, nodules, masses.  NEURO/ PSYCH:  No neurologic signs such as weakness numbness. No depression anxiety. IMM/ Allergy: No unusual infections.  Allergy .   REST of 12 system review negative except as per HPI   Past Medical History:  Diagnosis Date  . Abnormal Pap smear of cervix    rx cryo remote  has been normal  . Atrial septal defect    large echo 2008 and 2006  declines any surgical correction for belief reasons consutlts with [facility] . pt aware of risk of irreverible Pulm ht  causind devility and death is possible without correction. declines surgery and is not a candidate for other procedures.   Marland Kitchen Hx of abnormal cervical Pap smear   . Migraine   . Murmur   . Spell of transient neurologic symptoms 12/07/2012  . Syncopal episodes     Past Surgical History:  Procedure Laterality Date  . CARDIAC CATHETERIZATION     Summer 2009 [facility] large ASD 2:1 shunt  . Hosp  11/11/11   [facility] weakness r/o TIA neg eval  . TUBAL LIGATION    . US ECHOCARDIOGRAPHY     2008 and 2006    Family History  Problem Relation Age of Onset  . Breast cancer Mother   . Heart disease    . Other      step sister "hole in the heart"  . Other Son     SCFE    Social History   Social History  . Marital status: Married    Spouse name: N/A  . Number of children: N/A  . Years of education: N/A   Social History Main Topics  . Smoking status: Never Smoker  . Smokeless tobacco: Not on file  . Alcohol use No  . Drug use: No  . Sexual activity: Not on file   Other Topics Concern  . Not on file  Social History Narrative   Occupation: works at [employer] 40 hours week [place] getting a degree  Only has one more year  To graduate  Work and school   Married   Husband currently unemployed   Regular exercise- no   HH of 4   No pets   [employer] on [place] doesn't celebrate holidays   Doesn't do animal products and " natural"     No outpatient prescriptions prior to visit.   No facility-administered medications prior to visit.      EXAM:  BP 102/80 (BP Location: Right Arm, Patient Position: Sitting, Cuff Size: Normal)   Temp 97.7 F (36.5 C) (Oral)   Ht 5' 4.75" (1.645 m)   Wt 153 lb 9.6 oz (69.7 kg)   BMI 25.76 kg/m   Body mass index is 25.76 kg/m.  Physical Exam: Vital signs reviewed WYO:VZCH is a well-developed well-nourished alert cooperative    who appearsr stated age in no acute distress.  HEENT: normocephalic  atraumatic , Eyes: PERRL EOM's full, conjunctiva clear, Nares: paten,t no deformity discharge or tenderness., Ears: no deformity EAC's clear TMs with normal landmarks. Mouth: clear OP, no lesions, edema.  Moist mucous membranes. Dentition in adequate repair. NECK: supple without masses, thyromegaly or bruits. CHEST/PULM:  Clear to auscultation and percussion breath sounds equal no wheeze , rales or rhonchi. No chest wall deformities or tenderness. Breast: normal by inspection . No dimpling, discharge, masses, tenderness or discharge . CV: PMI is nondisplaced, S1 S2 hard to assess .  no gallops, 8-8/5 systolic lon murmur  No rub  Quiet rprecordiaum  murmurs, rubs. Peripheral pulses are full without delay.No JVD .  ABDOMEN: Bowel sounds normal nontender  No guard or rebound, no hepato splenomegal no CVA tenderness.  No hernia. Extremtities:  No clubbing cyanosis or edema, no acute joint swelling or redness no focal atrophy right little toe corn like area no redness   Ulcer  NEURO:  Oriented x3, cranial nerves 3-12 appear to be intact, no obvious focal weakness,gait within normal limits no abnormal reflexes or asymmetrical SKIN: No acute rashes normal turgor, color, no bruising or petechiae. PSYCH: Oriented, good eye contact, no obvious depression anxiety, cognition and judgment appear normal. LN: no cervical axillary inguinal adenopathy   Last pap 2014 ne hpv also  No sx ASSESSMENT AND PLAN:  Discussed the following assessment and plan:  Visit for preventive health examination  Atrial septal defect - Plan: ECHOCARDIOGRAM COMPLETE Lage secundum asd patient declines  Surgical intervention.  Last echo 2014  Local toe care appears to be a corn and nail tag . Pt declines surgery and flu vaccnine but will update her  On status of as flow  At thsi time she has no SX.  Patient Care Team: Burnis Medin, MD as PCP - General Patient Instructions  Continue lifestyle intervention healthy eating and  exercise .  Advise repeat echo test in the next year   Pap due in 2018 or 2019 .  Will let you know when labs results are back.    Health Maintenance, Female Adopting a healthy lifestyle and getting preventive care can go a long way to promote health and wellness. Talk with your health care provider about what schedule of regular examinations is right for you. This is a good chance for you to check in with your provider about disease prevention and staying healthy. In between checkups, there are plenty of things you  can do on your own. Experts have done a lot of research about which lifestyle changes and preventive measures are most likely to keep you healthy. Ask your health care provider for more information. WEIGHT AND DIET  Eat a healthy diet  Be sure to include plenty of vegetables, fruits, low-fat dairy products, and lean protein.  Do not eat a lot of foods high in solid fats, added sugars, or salt.  Get regular exercise. This is one of the most important things you can do for your health.  Most adults should exercise for at least 150 minutes each week. The exercise should increase your heart rate and make you sweat (moderate-intensity exercise).  Most adults should also do strengthening exercises at least twice a week. This is in addition to the moderate-intensity exercise.  Maintain a healthy weight  Body mass index (BMI) is a measurement that can be used to identify possible weight problems. It estimates body fat based on height and weight. Your health care provider can help determine your BMI and help you achieve or maintain a healthy weight.  For females 34 years of age and older:   A BMI below 18.5 is considered underweight.  A BMI of 18.5 to 24.9 is normal.  A BMI of 25 to 29.9 is considered overweight.  A BMI of 30 and above is considered obese.  Watch levels of cholesterol and blood lipids  You should start having your blood tested for lipids and cholesterol at  48 years of age, then have this test every 5 years.  You may need to have your cholesterol levels checked more often if:  Your lipid or cholesterol levels are high.  You are older than 48 years of age.  You are at high risk for heart disease.  CANCER SCREENING   Lung Cancer  Lung cancer screening is recommended for adults 63-67 years old who are at high risk for lung cancer because of a history of smoking.  A yearly low-dose CT scan of the lungs is recommended for people who:  Currently smoke.  Have quit within the past 15 years.  Have at least a 30-pack-year history of smoking. A pack year is smoking an average of one pack of cigarettes a day for 1 year.  Yearly screening should continue until it has been 15 years since you quit.  Yearly screening should stop if you develop a health problem that would prevent you from having lung cancer treatment.  Breast Cancer  Practice breast self-awareness. This means understanding how your breasts normally appear and feel.  It also means doing regular breast self-exams. Let your health care provider know about any changes, no matter how small.  If you are in your 20s or 30s, you should have a clinical breast exam (CBE) by a health care provider every 1-3 years as part of a regular health exam.  If you are 63 or older, have a CBE every year. Also consider having a breast X-ray (mammogram) every year.  If you have a family history of breast cancer, talk to your health care provider about genetic screening.  If you are at high risk for breast cancer, talk to your health care provider about having an MRI and a mammogram every year.  Breast cancer gene (BRCA) assessment is recommended for women who have family members with BRCA-related cancers. BRCA-related cancers include:  Breast.  Ovarian.  Tubal.  Peritoneal cancers.  Results of the assessment will determine the need for genetic counseling and BRCA1  and BRCA2  testing. Cervical Cancer Your health care provider may recommend that you be screened regularly for cancer of the pelvic organs (ovaries, uterus, and vagina). This screening involves a pelvic examination, including checking for microscopic changes to the surface of your cervix (Pap test). You may be encouraged to have this screening done every 3 years, beginning at age 20.  For women ages 74-65, health care providers may recommend pelvic exams and Pap testing every 3 years, or they may recommend the Pap and pelvic exam, combined with testing for human papilloma virus (HPV), every 5 years. Some types of HPV increase your risk of cervical cancer. Testing for HPV may also be done on women of any age with unclear Pap test results.  Other health care providers may not recommend any screening for nonpregnant women who are considered low risk for pelvic cancer and who do not have symptoms. Ask your health care provider if a screening pelvic exam is right for you.  If you have had past treatment for cervical cancer or a condition that could lead to cancer, you need Pap tests and screening for cancer for at least 20 years after your treatment. If Pap tests have been discontinued, your risk factors (such as having a new sexual partner) need to be reassessed to determine if screening should resume. Some women have medical problems that increase the chance of getting cervical cancer. In these cases, your health care provider may recommend more frequent screening and Pap tests. Colorectal Cancer  This type of cancer can be detected and often prevented.  Routine colorectal cancer screening usually begins at 48 years of age and continues through 48 years of age.  Your health care provider may recommend screening at an earlier age if you have risk factors for colon cancer.  Your health care provider may also recommend using home test kits to check for hidden blood in the stool.  A small camera at the end of a  tube can be used to examine your colon directly (sigmoidoscopy or colonoscopy). This is done to check for the earliest forms of colorectal cancer.  Routine screening usually begins at age 78.  Direct examination of the colon should be repeated every 5-10 years through 48 years of age. However, you may need to be screened more often if early forms of precancerous polyps or small growths are found. Skin Cancer  Check your skin from head to toe regularly.  Tell your health care provider about any new moles or changes in moles, especially if there is a change in a mole's shape or color.  Also tell your health care provider if you have a mole that is larger than the size of a pencil eraser.  Always use sunscreen. Apply sunscreen liberally and repeatedly throughout the day.  Protect yourself by wearing long sleeves, pants, a wide-brimmed hat, and sunglasses whenever you are outside. HEART DISEASE, DIABETES, AND HIGH BLOOD PRESSURE   High blood pressure causes heart disease and increases the risk of stroke. High blood pressure is more likely to develop in:  People who have blood pressure in the high end of the normal range (130-139/85-89 mm Hg).  People who are overweight or obese.  People who are African American.  If you are 45-16 years of age, have your blood pressure checked every 3-5 years. If you are 48 years of age or older, have your blood pressure checked every year. You should have your blood pressure measured twice--once when you are at a  hospital or clinic, and once when you are not at a hospital or clinic. Record the average of the two measurements. To check your blood pressure when you are not at a hospital or clinic, you can use:  An automated blood pressure machine at a pharmacy.  A home blood pressure monitor.  If you are between 31 years and 64 years old, ask your health care provider if you should take aspirin to prevent strokes.  Have regular diabetes screenings. This  involves taking a blood sample to check your fasting blood sugar level.  If you are at a normal weight and have a low risk for diabetes, have this test once every three years after 48 years of age.  If you are overweight and have a high risk for diabetes, consider being tested at a younger age or more often. PREVENTING INFECTION  Hepatitis B  If you have a higher risk for hepatitis B, you should be screened for this virus. You are considered at high risk for hepatitis B if:  You were born in a country where hepatitis B is common. Ask your health care provider which countries are considered high risk.  Your parents were born in a high-risk country, and you have not been immunized against hepatitis B (hepatitis B vaccine).  You have HIV or AIDS.  You use needles to inject street drugs.  You live with someone who has hepatitis B.  You have had sex with someone who has hepatitis B.  You get hemodialysis treatment.  You take certain medicines for conditions, including cancer, organ transplantation, and autoimmune conditions. Hepatitis C  Blood testing is recommended for:  Everyone born from [birth year] through 1963.  Anyone with known risk factors for hepatitis C. Sexually transmitted infections (STIs)  You should be screened for sexually transmitted infections (STIs) including gonorrhea and chlamydia if:  You are sexually active and are younger than 48 years of age.  You are older than 48 years of age and your health care provider tells you that you are at risk for this type of infection.  Your sexual activity has changed since you were last screened and you are at an increased risk for chlamydia or gonorrhea. Ask your health care provider if you are at risk.  If you do not have HIV, but are at risk, it may be recommended that you take a prescription medicine daily to prevent HIV infection. This is called pre-exposure prophylaxis (PrEP). You are considered at risk if:  You are  sexually active and do not regularly use condoms or know the HIV status of your partner(s).  You take drugs by injection.  You are sexually active with a partner who has HIV. Talk with your health care provider about whether you are at high risk of being infected with HIV. If you choose to begin PrEP, you should first be tested for HIV. You should then be tested every 3 months for as long as you are taking PrEP.  PREGNANCY   If you are premenopausal and you may become pregnant, ask your health care provider about preconception counseling.  If you may become pregnant, take 400 to 800 micrograms (mcg) of folic acid every day.  If you want to prevent pregnancy, talk to your health care provider about birth control (contraception). OSTEOPOROSIS AND MENOPAUSE   Osteoporosis is a disease in which the bones lose minerals and strength with aging. This can result in serious bone fractures. Your risk for osteoporosis can be identified using a  bone density scan.  If you are 59 years of age or older, or if you are at risk for osteoporosis and fractures, ask your health care provider if you should be screened.  Ask your health care provider whether you should take a calcium or vitamin D supplement to lower your risk for osteoporosis.  Menopause may have certain physical symptoms and risks.  Hormone replacement therapy may reduce some of these symptoms and risks. Talk to your health care provider about whether hormone replacement therapy is right for you.  HOME CARE INSTRUCTIONS   Schedule regular health, dental, and eye exams.  Stay current with your immunizations.   Do not use any tobacco products including cigarettes, chewing tobacco, or electronic cigarettes.  If you are pregnant, do not drink alcohol.  If you are breastfeeding, limit how much and how often you drink alcohol.  Limit alcohol intake to no more than 1 drink per day for nonpregnant women. One drink equals 12 ounces of beer, 5  ounces of wine, or 1 ounces of hard liquor.  Do not use street drugs.  Do not share needles.  Ask your health care provider for help if you need support or information about quitting drugs.  Tell your health care provider if you often feel depressed.  Tell your health care provider if you have ever been abused or do not feel safe at home.   This information is not intended to replace advice given to you by your health care provider. Make sure you discuss any questions you have with your health care provider.   Document Released: 01/31/2011 Document Revised: 08/08/2014 Document Reviewed: 06/19/2013 Elsevier Interactive Patient Education 2016 [place]. Charita Lindenberger M.D.

## 2016-06-03 ENCOUNTER — Telehealth: Payer: Self-pay | Admitting: Family Medicine

## 2016-06-03 MED ORDER — VITAMIN D (ERGOCALCIFEROL) 1.25 MG (50000 UNIT) PO CAPS: 50000.0000 [IU] | ORAL_CAPSULE | ORAL | 0 refills | Status: DC

## 2016-06-03 NOTE — Telephone Encounter (Signed)
Dr. Fabian SharpPanosh received a copy of labs for the pt from [facility].  Pt's vitamin D was 8.2 ng/ml.  Dr. Fabian SharpPanosh would like to send in a 50,000 iu of vitamin D to take for 12 weeks and then 1000 iu every day after OTC.  I spoke to the pt.  She agreed to have medication sent to the pharmacy.  She may or may not pick it up.  She stated she will try to find "another supplement."  Medication was sent electronically to the pharmacy.

## 2016-09-29 ENCOUNTER — Ambulatory Visit (HOSPITAL_COMMUNITY): Payer: 59

## 2016-10-19 ENCOUNTER — Other Ambulatory Visit: Payer: Self-pay

## 2016-10-19 ENCOUNTER — Ambulatory Visit (HOSPITAL_COMMUNITY): Payer: 59 | Attending: Internal Medicine

## 2016-10-19 ENCOUNTER — Encounter (INDEPENDENT_AMBULATORY_CARE_PROVIDER_SITE_OTHER): Payer: Self-pay

## 2016-10-19 DIAGNOSIS — R011 Cardiac murmur, unspecified: Secondary | ICD-10-CM | POA: Diagnosis not present

## 2016-10-19 DIAGNOSIS — Q211 Atrial septal defect, unspecified: Secondary | ICD-10-CM

## 2016-10-19 DIAGNOSIS — I071 Rheumatic tricuspid insufficiency: Secondary | ICD-10-CM | POA: Diagnosis not present

## 2016-10-19 DIAGNOSIS — R55 Syncope and collapse: Secondary | ICD-10-CM | POA: Diagnosis not present

## 2017-03-29 ENCOUNTER — Telehealth: Payer: Self-pay | Admitting: Internal Medicine

## 2017-03-29 NOTE — Telephone Encounter (Signed)
Pt has CPE on 10/24 and states Dr Fabian SharpPanosh usually mails her lab order script to her home so she can get through her employer, [facility]. Please advise.

## 2017-03-31 NOTE — Telephone Encounter (Signed)
Script has been mailed to pt house. Nothing further needed

## 2017-05-17 ENCOUNTER — Other Ambulatory Visit: Payer: Self-pay | Admitting: Internal Medicine

## 2017-05-18 LAB — COMPREHENSIVE METABOLIC PANEL
ALBUMIN: 4.5 g/dL (ref 3.5–5.5)
ALK PHOS: 83 IU/L (ref 39–117)
ALT: 14 IU/L (ref 0–32)
AST: 19 IU/L (ref 0–40)
Albumin/Globulin Ratio: 1.2 (ref 1.2–2.2)
BILIRUBIN TOTAL: 0.6 mg/dL (ref 0.0–1.2)
BUN / CREAT RATIO: 16 (ref 9–23)
BUN: 14 mg/dL (ref 6–24)
CO2: 23 mmol/L (ref 20–29)
CREATININE: 0.9 mg/dL (ref 0.57–1.00)
Calcium: 9.3 mg/dL (ref 8.7–10.2)
Chloride: 100 mmol/L (ref 96–106)
GFR calc non Af Amer: 75 mL/min/{1.73_m2} (ref >59)
GFR, EST AFRICAN AMERICAN: 87 mL/min/{1.73_m2} (ref >59)
GLUCOSE: 79 mg/dL (ref 65–99)
Globulin, Total: 3.7 g/dL (ref 1.5–4.5)
Potassium: 4.2 mmol/L (ref 3.5–5.2)
SODIUM: 138 mmol/L (ref 134–144)
Total Protein: 8.2 g/dL (ref 6.0–8.5)

## 2017-05-18 LAB — CBC WITH DIFFERENTIAL/PLATELET
BASOS: 0 %
Basophils Absolute: 0 10*3/uL (ref 0.0–0.2)
EOS (ABSOLUTE): 0.1 10*3/uL (ref 0.0–0.4)
Eos: 1 %
HEMATOCRIT: 42.5 % (ref 34.0–46.6)
Hemoglobin: 13.9 g/dL (ref 11.1–15.9)
IMMATURE GRANS (ABS): 0 10*3/uL (ref 0.0–0.1)
IMMATURE GRANULOCYTES: 0 %
LYMPHS: 27 %
Lymphocytes Absolute: 1.8 10*3/uL (ref 0.7–3.1)
MCH: 29.1 pg (ref 26.6–33.0)
MCHC: 32.7 g/dL (ref 31.5–35.7)
MCV: 89 fL (ref 79–97)
Monocytes Absolute: 0.4 10*3/uL (ref 0.1–0.9)
Monocytes: 6 %
NEUTROS PCT: 66 %
Neutrophils Absolute: 4.5 10*3/uL (ref 1.4–7.0)
Platelets: 205 10*3/uL (ref 150–379)
RBC: 4.77 x10E6/uL (ref 3.77–5.28)
RDW: 14.4 % (ref 12.3–15.4)
WBC: 6.8 10*3/uL (ref 3.4–10.8)

## 2017-05-18 LAB — LIPID PANEL W/O CHOL/HDL RATIO
CHOLESTEROL TOTAL: 215 mg/dL — AB (ref 100–199)
HDL: 50 mg/dL (ref >39)
LDL CALC: 149 mg/dL — AB (ref 0–99)
Triglycerides: 80 mg/dL (ref 0–149)
VLDL CHOLESTEROL CAL: 16 mg/dL (ref 5–40)

## 2017-05-18 LAB — TSH: TSH: 1.59 u[IU]/mL (ref 0.450–4.500)

## 2017-05-23 NOTE — Progress Notes (Signed)
Chief Complaint  Patient presents with  . Annual Exam    No concerns.     HPI: Patient  Beth Romero  51 y.o. comes in today for Preventive Health Care visit having been evaluated in the past by cardiology. She has known ASD   significant enough that previously was advised to have open heart surgery.  Since that time she is declined further evaluation and surgery and in the past has stated that she would know if there is a time to have surgery.  Otherwise she has been doing well working full-time coming in for her yearly checkups. She did have an echocardiogram done earlier in the spring ;see results below.  Has not been eating as healthy recently but will get back to this. 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 it eventually.   Health Maintenance  Topic Date Due  . HIV Screening  05/07/1983  . INFLUENZA VACCINE  10/29/2017 (Originally 03/01/2017)  . PAP SMEAR  05/01/2018 (Originally 06/24/2016)  . TETANUS/TDAP  07/05/2021   Health Maintenance Review LIFESTYLE:  Exercise: Walking treadmill at the gym denies exercise intolerance or change Tobacco/ETS:n Alcohol: n Sugar beverages:n to some recent Sleep:6-7 hours Drug use: no HH of 3 Work: ft and school 50 year old son at home had hip disc problem and had to have hip surgery in the last year but is doing well now another child at [employer] pre-engineering.  Daughter at [facility].    ROS:  GEN/ HEENT: No fever, significant weight changes sweats headaches vision problems hearing changes, CV/ PULM; No chest pain shortness of breath cough, syncope,edema  change in exercise tolerance. GI /GU: No adominal pain, vomiting, change in bowel habits. No blood in the stool. No significant GU symptoms. SKIN/HEME: ,no acute skin rashes suspicious lesions or bleeding. No lymphadenopathy, nodules, masses.  NEURO/ PSYCH:  No neurologic signs such as weakness numbness. No  depression anxiety. IMM/ Allergy: No unusual infections.  Allergy .   REST of 12 system review negative except as per HPI   Past Medical History:  Diagnosis Date  . Abnormal Pap smear of cervix    rx cryo remote  has been normal  . Atrial septal defect    large echo 2008 and 2006  declines any surgical correction for belief reasons consutlts with [facility] . pt aware of risk of irreverible Pulm ht causind devility and death is possible without correction. declines surgery and is not a candidate for other procedures.   Marland Kitchen Hx of abnormal cervical Pap smear   . Migraine   . Murmur   . Spell of transient neurologic symptoms 12/07/2012  . Syncopal episodes     Past Surgical History:  Procedure Laterality Date  . CARDIAC CATHETERIZATION     Summer 2009 [facility] large ASD 2:1 shunt  . Hosp  11/11/11   [facility] weakness r/o TIA neg eval  . TUBAL LIGATION    . US ECHOCARDIOGRAPHY     2008 and 2006    Family History  Problem Relation Age of Onset  . Breast cancer Mother   . Heart disease Unknown   . Other Unknown        step sister "hole in the heart"  . Other Son        SCFE    Social History   Social History  . Marital status: Married    Spouse name: N/A  . Number of children:  N/A  . Years of education: N/A   Social History Main Topics  . Smoking status: Never Smoker  . Smokeless tobacco: Never Used  . Alcohol use No  . Drug use: No  . Sexual activity: Not on file   Other Topics Concern  . Not on file   Social History Narrative   Occupation: works at [employer] 40 hours week [place] getting a degree  Only has one more year  To graduate  Work and school   Married   Husband currently unemployed   Regular exercise- no   HH of 4   No pets   [employer] on [place] doesn't celebrate holidays   Doesn't do animal products and " natural"     Outpatient Medications Prior to Visit  Medication Sig Dispense Refill  . Vitamin D,  Ergocalciferol, (DRISDOL) 50000 units CAPS capsule Take 1 capsule (50,000 Units total) by mouth every 7 (seven) days. 12 capsule 0   No facility-administered medications prior to visit.      EXAM:  BP 102/78 (BP Location: Right Arm, Patient Position: Sitting, Cuff Size: Normal)   Pulse 76   Temp 98.2 F (36.8 C) (Oral)   Ht 5' 4.5" (1.638 m)   Wt 160 lb 1.6 oz (72.6 kg)   BMI 27.06 kg/m   Body mass index is 27.06 kg/m. Wt Readings from Last 3 Encounters:  05/24/17 160 lb 1.6 oz (72.6 kg)  05/18/16 153 lb 9.6 oz (69.7 kg)  06/30/15 153 lb 8 oz (69.6 kg)    Physical Exam: Vital signs reviewed QQP:YPPJ is a well-developed well-nourished alert cooperative    who appearsr stated age in no acute distress.  Pleasant and looks well. HEENT: normocephalic atraumatic , Eyes: PERRL EOM's full, conjunctiva clear, Nares: paten,t no deformity discharge or tenderness., Ears: no deformity EAC's clear TMs with normal landmarks. Mouth: clear OP, no lesions, edema.  Moist mucous membranes. Dentition in adequate repair. NECK: supple without masses, thyromegaly or bruits. CHEST/PULM:  Clear to auscultation and percussion breath sounds equal no wheeze , rales or rhonchi. No chest wall deformities or tenderness. Breast: normal by inspection . No dimpling, discharge, masses, tenderness or discharge . CV: PMI is nondisplaced, prominent S2 systolic murmur heard throughout left more than right but does radiate to the left upper back.  Occasional premature beat otherwise regular rhythm.. Peripheral pulses are full without delay.No JVD .  ABDOMEN: Bowel sounds normal nontender  No guard or rebound, no hepato splenomegal no CVA tenderness.  No hernia. Extremtities:  No clubbing cyanosis or edema, no acute joint swelling or redness no focal atrophy NEURO:  Oriented x3, cranial nerves 3-12 appear to be intact, no obvious focal weakness,gait within normal limits no abnormal reflexes or asymmetrical SKIN: No acute  rashes normal turgor, color, no bruising or petechiae. PSYCH: Oriented, good eye contact, no obvious depression anxiety, cognition and judgment appear normal. LN: no cervical axillary inguinal adenopathy  Lab Results  Component Value Date   WBC 6.8 05/17/2017   HGB 13.9 05/17/2017   HCT 42.5 05/17/2017   PLT 205 05/17/2017   GLUCOSE 79 05/17/2017   CHOL 215 (H) 05/17/2017   TRIG 80 05/17/2017   HDL 50 05/17/2017   LDLCALC 149 (H) 05/17/2017   ALT 14 05/17/2017   AST 19 05/17/2017   NA 138 05/17/2017   K 4.2 05/17/2017   CL 100 05/17/2017   CREATININE 0.90 05/17/2017   BUN 14 05/17/2017  CO2 23 05/17/2017   TSH 1.590 05/17/2017   INR 1.1 11/11/2008   HGBA1C  11/11/2008    5.7 (NOTE) The ADA recommends the following therapeutic goal for glycemic control related to Hgb A1c measurement: Goal of therapy: <6.5 Hgb A1c  Reference: American Diabetes Association: Clinical Practice Recommendations 2010, Diabetes Care, 2010, 33: (Suppl  1).    BP Readings from Last 3 Encounters:  05/24/17 102/78  05/18/16 102/80  06/30/15 106/76    Lab results reviewed with patient   ASSESSMENT AND PLAN:  Discussed the following assessment and plan:  Visit for preventive health examination  Atrial septal defect  Hyperlipidemia, unspecified hyperlipidemia type  Pap smear of cervix declined  Influenza vaccination declined by patient Reviewed echocardiogram with patient done 6 months ago some evidence now of right sided heart strain dysfunction failure with no clinical findings.  Reviewed this with patient again about considering consult again about options of intervention. Reviewed the pathophysiology of ASD again Reviewed the worse case scenario such as Eisenmenger complex heart arrhythmias stroke.  And heart failure. Ms. Merrilee Jansky is aware ; states that she understands and will not agree to have surgery nor spend thus the cost of seeing a consultant.  We reviewed possibilities of  more information for informed decision and she still declines a consult. She states she is fully aware of the risks of not doing surgery and the potential of irreversibility  of untreated left  high flow ASD in the future. I have no this patient for a number of years since the birth of her last child 12 years ago and believe that she is in sound mind making these decisions.  To decline intervention surgery for her heart condition. At this time we will abide by the patient's wishes and revisit yearly. Patient Care Team: Burnis Medin, MD as PCP - General Patient Instructions   Optimize healthy eating to help with the cholesterol levels.  I want  To get another updated  Opinion about the atrial septal defect  Condition and interventions. . Possible  So you can make informed chiced about  The heart contrition        Preventive Care 40-64 Years, Female Preventive care refers to lifestyle choices and visits with your health care provider that can promote health and wellness. What does preventive care include?  A yearly physical exam. This is also called an annual well check.  Dental exams once or twice a year.  Routine eye exams. Ask your health care provider how often you should have your eyes checked.  Personal lifestyle choices, including: ? Daily care of your teeth and gums. ? Regular physical activity. ? Eating a healthy diet. ? Avoiding tobacco and drug use. ? Limiting alcohol use. ? Practicing safe sex. ? Taking low-dose aspirin daily starting at age 25. ? Taking vitamin and mineral supplements as recommended by your health care provider. What happens during an annual well check? The services and screenings done by your health care provider during your annual well check will depend on your age, overall health, lifestyle risk factors, and family history of disease. Counseling Your health care provider may ask you questions about your:  Alcohol use.  Tobacco use.  Drug  use.  Emotional well-being.  Home and relationship well-being.  Sexual activity.  Eating habits.  Work and work Statistician.  Method of birth control.  Menstrual cycle.  Pregnancy history.  Screening You may have the following tests or measurements:  Height, weight, and BMI.  Blood pressure.  Lipid and cholesterol levels. These may be checked every 5 years, or more frequently if you are over 49 years old.  Skin check.  Lung cancer screening. You may have this screening every year starting at age 105 if you have a 30-pack-year history of smoking and currently smoke or have quit within the past 15 years.  Fecal occult blood test (FOBT) of the stool. You may have this test every year starting at age 24.  Flexible sigmoidoscopy or colonoscopy. You may have a sigmoidoscopy every 5 years or a colonoscopy every 10 years starting at age 14.  Hepatitis C blood test.  Hepatitis B blood test.  Sexually transmitted disease (STD) testing.  Diabetes screening. This is done by checking your blood sugar (glucose) after you have not eaten for a while (fasting). You may have this done every 1-3 years.  Mammogram. This may be done every 1-2 years. Talk to your health care provider about when you should start having regular mammograms. This may depend on whether you have a family history of breast cancer.  BRCA-related cancer screening. This may be done if you have a family history of breast, ovarian, tubal, or peritoneal cancers.  Pelvic exam and Pap test. This may be done every 3 years starting at age 64. Starting at age 10, this may be done every 5 years if you have a Pap test in combination with an HPV test.  Bone density scan. This is done to screen for osteoporosis. You may have this scan if you are at high risk for osteoporosis.  Discuss your test results, treatment options, and if necessary, the need for more tests with your health care provider. Vaccines Your health care  provider may recommend certain vaccines, such as:  Influenza vaccine. This is recommended every year.  Tetanus, diphtheria, and acellular pertussis (Tdap, Td) vaccine. You may need a Td booster every 10 years.  Varicella vaccine. You may need this if you have not been vaccinated.  Zoster vaccine. You may need this after age 52.  Measles, mumps, and rubella (MMR) vaccine. You may need at least one dose of MMR if you were born in 1957 or later. You may also need a second dose.  Pneumococcal 13-valent conjugate (PCV13) vaccine. You may need this if you have certain conditions and were not previously vaccinated.  Pneumococcal polysaccharide (PPSV23) vaccine. You may need one or two doses if you smoke cigarettes or if you have certain conditions.  Meningococcal vaccine. You may need this if you have certain conditions.  Hepatitis A vaccine. You may need this if you have certain conditions or if you travel or work in places where you may be exposed to hepatitis A.  Hepatitis B vaccine. You may need this if you have certain conditions or if you travel or work in places where you may be exposed to hepatitis B.  Haemophilus influenzae type b (Hib) vaccine. You may need this if you have certain conditions.  Talk to your health care provider about which screenings and vaccines you need and how often you need them. This information is not intended to replace advice given to you by your health care provider. Make sure you discuss any questions you have with your health care provider. Document Released: 08/14/2015 Document Revised: 04/06/2016 Document Reviewed: 05/19/2015 Elsevier Interactive Patient Education  2017 [employer].     Why follow it? Research shows. . Those who follow the Mediterranean diet have a reduced risk of heart disease  .  The diet is associated with a reduced incidence of Parkinson's and Alzheimer's diseases . People following the diet may have longer life expectancies and  lower rates of chronic diseases  . The Dietary Guidelines for Americans recommends the Mediterranean diet as an eating plan to promote health and prevent disease  What Is the Mediterranean Diet?  . Healthy eating plan based on typical foods and recipes of Mediterranean-style cooking . The diet is primarily a plant based diet; these foods should make up a majority of meals   Starches - Plant based foods should make up a majority of meals - They are an important sources of vitamins, minerals, energy, antioxidants, and fiber - Choose whole grains, foods high in fiber and minimally processed items  - Typical grain sources include wheat, oats, barley, corn, brown rice, bulgar, farro, millet, polenta, couscous  - Various types of beans include chickpeas, lentils, fava beans, black beans, white beans   Fruits  Veggies - Large quantities of antioxidant rich fruits & veggies; 6 or more servings  - Vegetables can be eaten raw or lightly drizzled with oil and cooked  - Vegetables common to the traditional Mediterranean Diet include: artichokes, arugula, beets, broccoli, brussel sprouts, cabbage, carrots, celery, collard greens, cucumbers, eggplant, kale, leeks, lemons, lettuce, mushrooms, okra, onions, peas, peppers, potatoes, pumpkin, radishes, rutabaga, shallots, spinach, sweet potatoes, turnips, zucchini - Fruits common to the Mediterranean Diet include: apples, apricots, avocados, cherries, clementines, dates, figs, grapefruits, grapes, melons, nectarines, oranges, peaches, pears, pomegranates, strawberries, tangerines  Fats - Replace butter and margarine with healthy oils, such as olive oil, canola oil, and tahini  - Limit nuts to no more than a handful a day  - Nuts include walnuts, almonds, pecans, pistachios, pine nuts  - Limit or avoid candied, honey roasted or heavily salted nuts - Olives are central to the [employer] - can be eaten whole or used in a variety of dishes   Meats Protein -  Limiting red meat: no more than a few times a month - When eating red meat: choose lean cuts and keep the portion to the size of deck of cards - Eggs: approx. 0 to 4 times a week  - Fish and lean poultry: at least 2 a week  - Healthy protein sources include, chicken, [place], lean beef, lamb - Increase intake of seafood such as tuna, salmon, trout, mackerel, shrimp, scallops - Avoid or limit high fat processed meats such as sausage and bacon  Dairy - Include moderate amounts of low fat dairy products  - Focus on healthy dairy such as fat free yogurt, skim milk, low or reduced fat cheese - Limit dairy products higher in fat such as whole or 2% milk, cheese, ice cream  Alcohol - Moderate amounts of red wine is ok  - No more than 5 oz daily for women (all ages) and men older than age 5  - No more than 10 oz of wine daily for men younger than 21  Other - Limit sweets and other desserts  - Use herbs and spices instead of salt to flavor foods  - Herbs and spices common to the traditional Mediterranean Diet include: basil, bay leaves, chives, cloves, cumin, fennel, garlic, lavender, marjoram, mint, oregano, parsley, pepper, rosemary, sage, savory, sumac, tarragon, thyme   It's not just a diet, it's a lifestyle:  . The Mediterranean diet includes lifestyle factors typical of those in the region  . Foods, drinks and meals are best eaten with others and  savored . Daily physical activity is important for overall good health . This could be strenuous exercise like running and aerobics . This could also be more leisurely activities such as walking, housework, yard-work, or taking the stairs . Moderation is the key; a balanced and healthy diet accommodates most foods and drinks . Consider portion sizes and frequency of consumption of certain foods   Meal Ideas & Options:  . Breakfast:  o Whole wheat toast or whole wheat English muffins with peanut butter & hard boiled egg o Steel cut oats topped with  apples & cinnamon and skim milk  o Fresh fruit: banana, strawberries, melon, berries, peaches  o Smoothies: strawberries, bananas, greek yogurt, peanut butter o Low fat greek yogurt with blueberries and granola  o Egg white omelet with spinach and mushrooms o Breakfast couscous: whole wheat couscous, apricots, skim milk, cranberries  . Sandwiches:  o Hummus and grilled vegetables (peppers, zucchini, squash) on whole wheat bread   o Grilled chicken on whole wheat pita with lettuce, tomatoes, cucumbers or tzatziki  o Tuna salad on whole wheat bread: tuna salad made with greek yogurt, olives, red peppers, capers, green onions o Garlic rosemary lamb pita: lamb sauted with garlic, rosemary, salt & pepper; add lettuce, cucumber, greek yogurt to pita - flavor with lemon juice and black pepper  . Seafood:  o Mediterranean grilled salmon, seasoned with garlic, basil, parsley, lemon juice and black pepper o Shrimp, lemon, and spinach whole-grain pasta salad made with low fat greek yogurt  o Seared scallops with lemon orzo  o Seared tuna steaks seasoned salt, pepper, coriander topped with tomato mixture of olives, tomatoes, olive oil, minced garlic, parsley, green onions and cappers  . Meats:  o Herbed greek chicken salad with kalamata olives, cucumber, feta  o Red bell peppers stuffed with spinach, bulgur, lean ground beef (or lentils) & topped with feta   o Kebabs: skewers of chicken, tomatoes, onions, zucchini, squash  o [place] burgers: made with red onions, mint, dill, lemon juice, feta cheese topped with roasted red peppers . Vegetarian o Cucumber salad: cucumbers, artichoke hearts, celery, red onion, feta cheese, tossed in olive oil & lemon juice  o Hummus and whole grain pita points with a greek salad (lettuce, tomato, feta, olives, cucumbers, red onion) o Lentil soup with celery, carrots made with vegetable broth, garlic, salt and pepper  o Tabouli salad: parsley, bulgur, mint, scallions,  cucumbers, tomato, radishes, lemon juice, olive oil, salt and pepper.        Atrial Septal Defect, Adult An atrial septal defect (ASD) is a hole in the heart. This hole is located in the thin tissue (septum) that separates the two upper chambers of the heart (right atrium and left atrium). This hole is present at birth (congenital). A few minutes after birth, this hole normally closes so that blood is not able to go between the right atrium and left atrium. Normally, blood from the right side of the heart is pumped to the lungs, where the blood has oxygen added to it (is oxygenated). The oxygenated blood from the lungs is then pumped to the left side of the heart. From the left side of the heart, blood is pumped out to the rest of the body. When an ASD occurs, blood from the left atrium mixes with blood in the right atrium. The blood is then recirculated to the lungs and left side of the heart. In other words, the blood makes the trip twice. An ASD  makes the heart work harder by increasing the amount of blood in the right side of the heart. This causes heart overload and eventually weakens the heart's ability to pump. What are the causes? The cause of this congenital condition is not known. What are the signs or symptoms? Symptoms of this condition include:  Tiredness or fatigue.  Trouble breathing or shortness of breath.  Irregular heartbeats (arrhythmias).  An extra "swishing" or "whooshing" sound (heart murmur) that is heard when listening to the heart.  How is this diagnosed? This condition is diagnosed based on the results of one or more of the following tests:  Electrocardiogram, ECG. This records the electrical activity of your heart and traces the patterns of your heartbeat.  Chest X-ray exam.  Echocardiogram. There are two types that may be used: ? Transthoracic echocardiogram (TTE). A TTE is very effective in detecting the two most common types of ASD, ostium primum or ostium  secundum. It is not as sensitive in detecting a less common form of ASD, sinus venosus. ? Transesophageal echocardiogram (TEE). A TEE is especially helpful in patients who have a thin or easily movable (mobile) septum, making ASD detection more accurate.  MRI or CT scan.  Cardiac catheterization. In this procedure: ? A small tube (catheter) is passed through a large vein in your neck, groin, or arm. ? Your cardiologist visualizes your heart defect, checks how well your heart is pumping, and checks the function of your heart valves.  How is this treated? Treatment for this condition depends on the size of the hole and the amount of blood that goes into the right atrium.  No treatment is required if only a small amount of blood is moving back and forth (shunting) from the left to right atrium.  Minimally invasive closure may be done depending on the type and location of the ASD. This treatment is done in a cardiac catheterization lab. ? A catheter is inserted into a large blood vessel. ? The catheter is advanced to the ASD in the heart. ? A patch resembling an umbrella is threaded up the catheter and placed in the ASD hole. ? The patch is then "opened up" to close off the hole.  Open heart surgery may be necessary if minimally invasive closure cannot be done. In open heart surgery: ? If the ASD is small, the hole can be closed with stitches. ? If the ASD is large, a patch is sewn over the defect so the hole is closed.  Get help right away if:  You experience unusual fatigue when you exert yourself.  You have chest pain at rest or with exertion.  You notice your fingertips or lips turning pale or blue. This information is not intended to replace advice given to you by your health care provider. Make sure you discuss any questions you have with your health care provider. Document Released: 03/01/2004 Document Revised: 03/17/2016 Document Reviewed: 03/17/2016 Elsevier Interactive Patient  Education  2018 [place]. Panosh M.D.   LV EF: 65% -   70%  ------------------------------------------------------------------- Indications:      ASD (Q21.1).  ------------------------------------------------------------------- History:   PMH:  Large Atrial Septal Defect.  Syncope and murmur.   ------------------------------------------------------------------- Study Conclusions  - Left ventricle: The cavity size was normal. Wall thickness was   normal. Systolic function was vigorous. The estimated ejection   fraction was in the range of 65% to 70%. Wall motion was normal;   there were no regional  wall motion abnormalities. The mitral   inflow pattern is codominant, suggesting borderline diastolic   dysfunction. - Left atrium: The atrium was normal in size. - Right ventricle: The cavity size was moderately dilated. Systolic   function is moderately reduced. - Right atrium: The atrium was mildly dilated. - Atrial septum: Large atrial septal defect. - Tricuspid valve: There was mild regurgitation. - Pulmonary arteries: PA peak pressure: 46 mm Hg (S). - Inferior vena cava: The vessel was dilated. The respirophasic   diameter changes were blunted (< 50%), consistent with elevated   central venous pressure.  Impressions:  - LVEF 65-70%, mild RAE and moderate RVE with moderately reduced RV   systolic function, large ASD is again noted, normal LA size, mild   TR, RVSP 46 mmHg, dilated IVC.  ------------------------------------------------------------------- Study data:  Comparison was made to the study of 12/10/2012.  Study status:  Routine.  Procedure:  Transthoracic echocardiography. Image quality was adequate.          Transthoracic echocardiography.  M-mode, complete 2D, spectral Doppler, and color Doppler.  Birthdate:  Patient birthdate: 02-20-1966.  Age:  Patient is 51 yr old.  Sex:  Gender: female.    BMI: 25.7 kg/m^2.  Blood pressure:     102/80   Patient status:  Outpatient.  Study date: Study date: 10/19/2016. Study time: 03:13 PM.  Location:  [facility]  -------------------------------------------------------------------  ------------------------------------------------------------------- Left ventricle:  The cavity size was normal. Wall thickness was normal. Systolic function was vigorous. The estimated ejection fraction was in the range of 65% to 70%. Wall motion was normal; there were no regional wall motion abnormalities. The mitral inflow pattern is codominant, suggesting borderline diastolic dysfunction.   ------------------------------------------------------------------- Aortic valve:   Structurally normal valve. Trileaflet. Cusp separation was normal.  Doppler:  Transvalvular velocity was within the normal range. There was no stenosis. There was no regurgitation.  ------------------------------------------------------------------- Aorta:  Aortic root: The aortic root was normal in size. Ascending aorta: The ascending aorta was normal in size.  ------------------------------------------------------------------- Mitral valve:   Structurally normal valve.   Leaflet separation was normal.  Doppler:  Transvalvular velocity was within the normal range. There was no evidence for stenosis. There was no regurgitation.    Peak gradient (D): 3 mm Hg.  ------------------------------------------------------------------- Left atrium:  The atrium was normal in size.  ------------------------------------------------------------------- Atrial septum:  Large atrial septal defect.  ------------------------------------------------------------------- Right ventricle:  The cavity size was moderately dilated. Systolic function is moderately reduced.  ------------------------------------------------------------------- Pulmonic valve:    The valve appears to be grossly normal. Doppler:  There was no significant  regurgitation. Peak gradient (S): 10 mm Hg.  ------------------------------------------------------------------- Tricuspid valve:   Doppler:  There was mild regurgitation.  ------------------------------------------------------------------- Pulmonary artery:   The main pulmonary artery was normal-sized.  ------------------------------------------------------------------- Right atrium:  The atrium was mildly dilated.  ------------------------------------------------------------------- Pericardium:  There was no pericardial effusion.  ------------------------------------------------------------------- Systemic veins: Inferior vena cava: The vessel was dilated. The respirophasic diameter changes were blunted (< 50%), consistent with elevated central venous pressure.  -------------------------------------------------------------------

## 2017-05-24 ENCOUNTER — Encounter: Payer: Self-pay | Admitting: Internal Medicine

## 2017-05-24 ENCOUNTER — Ambulatory Visit (INDEPENDENT_AMBULATORY_CARE_PROVIDER_SITE_OTHER): Payer: 59 | Admitting: Internal Medicine

## 2017-05-24 VITALS — BP 102/78 | HR 76 | Temp 98.2°F | Ht 64.5 in | Wt 160.1 lb

## 2017-05-24 DIAGNOSIS — Z2821 Immunization not carried out because of patient refusal: Secondary | ICD-10-CM | POA: Diagnosis not present

## 2017-05-24 DIAGNOSIS — E785 Hyperlipidemia, unspecified: Secondary | ICD-10-CM | POA: Diagnosis not present

## 2017-05-24 DIAGNOSIS — Z5320 Procedure and treatment not carried out because of patient's decision for unspecified reasons: Secondary | ICD-10-CM

## 2017-05-24 DIAGNOSIS — Q211 Atrial septal defect, unspecified: Secondary | ICD-10-CM

## 2017-05-24 DIAGNOSIS — Z0000 Encounter for general adult medical examination without abnormal findings: Secondary | ICD-10-CM

## 2017-05-24 NOTE — Patient Instructions (Addendum)
Optimize healthy eating to help with the cholesterol levels.  I want  To get another updated  Opinion about the atrial septal defect  Condition and interventions. . Possible  So you can make informed chiced about  The heart contrition        Preventive Care 40-64 Years, Female Preventive care refers to lifestyle choices and visits with your health care provider that can promote health and wellness. What does preventive care include?  A yearly physical exam. This is also called an annual well check.  Dental exams once or twice a year.  Routine eye exams. Ask your health care provider how often you should have your eyes checked.  Personal lifestyle choices, including: ? Daily care of your teeth and gums. ? Regular physical activity. ? Eating a healthy diet. ? Avoiding tobacco and drug use. ? Limiting alcohol use. ? Practicing safe sex. ? Taking low-dose aspirin daily starting at age 28. ? Taking vitamin and mineral supplements as recommended by your health care provider. What happens during an annual well check? The services and screenings done by your health care provider during your annual well check will depend on your age, overall health, lifestyle risk factors, and family history of disease. Counseling Your health care provider may ask you questions about your:  Alcohol use.  Tobacco use.  Drug use.  Emotional well-being.  Home and relationship well-being.  Sexual activity.  Eating habits.  Work and work Statistician.  Method of birth control.  Menstrual cycle.  Pregnancy history.  Screening You may have the following tests or measurements:  Height, weight, and BMI.  Blood pressure.  Lipid and cholesterol levels. These may be checked every 5 years, or more frequently if you are over 86 years old.  Skin check.  Lung cancer screening. You may have this screening every year starting at age 66 if you have a 30-pack-year history of smoking and currently  smoke or have quit within the past 15 years.  Fecal occult blood test (FOBT) of the stool. You may have this test every year starting at age 49.  Flexible sigmoidoscopy or colonoscopy. You may have a sigmoidoscopy every 5 years or a colonoscopy every 10 years starting at age 81.  Hepatitis C blood test.  Hepatitis B blood test.  Sexually transmitted disease (STD) testing.  Diabetes screening. This is done by checking your blood sugar (glucose) after you have not eaten for a while (fasting). You may have this done every 1-3 years.  Mammogram. This may be done every 1-2 years. Talk to your health care provider about when you should start having regular mammograms. This may depend on whether you have a family history of breast cancer.  BRCA-related cancer screening. This may be done if you have a family history of breast, ovarian, tubal, or peritoneal cancers.  Pelvic exam and Pap test. This may be done every 3 years starting at age 92. Starting at age 53, this may be done every 5 years if you have a Pap test in combination with an HPV test.  Bone density scan. This is done to screen for osteoporosis. You may have this scan if you are at high risk for osteoporosis.  Discuss your test results, treatment options, and if necessary, the need for more tests with your health care provider. Vaccines Your health care provider may recommend certain vaccines, such as:  Influenza vaccine. This is recommended every year.  Tetanus, diphtheria, and acellular pertussis (Tdap, Td) vaccine. You may need a  Td booster every 10 years.  Varicella vaccine. You may need this if you have not been vaccinated.  Zoster vaccine. You may need this after age 67.  Measles, mumps, and rubella (MMR) vaccine. You may need at least one dose of MMR if you were born in 1957 or later. You may also need a second dose.  Pneumococcal 13-valent conjugate (PCV13) vaccine. You may need this if you have certain conditions and  were not previously vaccinated.  Pneumococcal polysaccharide (PPSV23) vaccine. You may need one or two doses if you smoke cigarettes or if you have certain conditions.  Meningococcal vaccine. You may need this if you have certain conditions.  Hepatitis A vaccine. You may need this if you have certain conditions or if you travel or work in places where you may be exposed to hepatitis A.  Hepatitis B vaccine. You may need this if you have certain conditions or if you travel or work in places where you may be exposed to hepatitis B.  Haemophilus influenzae type b (Hib) vaccine. You may need this if you have certain conditions.  Talk to your health care provider about which screenings and vaccines you need and how often you need them. This information is not intended to replace advice given to you by your health care provider. Make sure you discuss any questions you have with your health care provider. Document Released: 08/14/2015 Document Revised: 04/06/2016 Document Reviewed: 05/19/2015 Elsevier Interactive Patient Education  2017 [employer].     Why follow it? Research shows. . Those who follow the Mediterranean diet have a reduced risk of heart disease  . The diet is associated with a reduced incidence of Parkinson's and Alzheimer's diseases . People following the diet may have longer life expectancies and lower rates of chronic diseases  . The Dietary Guidelines for Americans recommends the Mediterranean diet as an eating plan to promote health and prevent disease  What Is the Mediterranean Diet?  . Healthy eating plan based on typical foods and recipes of Mediterranean-style cooking . The diet is primarily a plant based diet; these foods should make up a majority of meals   Starches - Plant based foods should make up a majority of meals - They are an important sources of vitamins, minerals, energy, antioxidants, and fiber - Choose whole grains, foods high in fiber and minimally  processed items  - Typical grain sources include wheat, oats, barley, corn, brown rice, bulgar, farro, millet, polenta, couscous  - Various types of beans include chickpeas, lentils, fava beans, black beans, white beans   Fruits  Veggies - Large quantities of antioxidant rich fruits & veggies; 6 or more servings  - Vegetables can be eaten raw or lightly drizzled with oil and cooked  - Vegetables common to the traditional Mediterranean Diet include: artichokes, arugula, beets, broccoli, brussel sprouts, cabbage, carrots, celery, collard greens, cucumbers, eggplant, kale, leeks, lemons, lettuce, mushrooms, okra, onions, peas, peppers, potatoes, pumpkin, radishes, rutabaga, shallots, spinach, sweet potatoes, turnips, zucchini - Fruits common to the Mediterranean Diet include: apples, apricots, avocados, cherries, clementines, dates, figs, grapefruits, grapes, melons, nectarines, oranges, peaches, pears, pomegranates, strawberries, tangerines  Fats - Replace butter and margarine with healthy oils, such as olive oil, canola oil, and tahini  - Limit nuts to no more than a handful a day  - Nuts include walnuts, almonds, pecans, pistachios, pine nuts  - Limit or avoid candied, honey roasted or heavily salted nuts - Olives are central to the Mediterranean diet - can  be eaten whole or used in a variety of dishes   Meats Protein - Limiting red meat: no more than a few times a month - When eating red meat: choose lean cuts and keep the portion to the size of deck of cards - Eggs: approx. 0 to 4 times a week  - Fish and lean poultry: at least 2 a week  - Healthy protein sources include, chicken, [place], lean beef, lamb - Increase intake of seafood such as tuna, salmon, trout, mackerel, shrimp, scallops - Avoid or limit high fat processed meats such as sausage and bacon  Dairy - Include moderate amounts of low fat dairy products  - Focus on healthy dairy such as fat free yogurt, skim milk, low or reduced fat  cheese - Limit dairy products higher in fat such as whole or 2% milk, cheese, ice cream  Alcohol - Moderate amounts of red wine is ok  - No more than 5 oz daily for women (all ages) and men older than age 72  - No more than 10 oz of wine daily for men younger than 52  Other - Limit sweets and other desserts  - Use herbs and spices instead of salt to flavor foods  - Herbs and spices common to the traditional Mediterranean Diet include: basil, bay leaves, chives, cloves, cumin, fennel, garlic, lavender, marjoram, mint, oregano, parsley, pepper, rosemary, sage, savory, sumac, tarragon, thyme   It's not just a diet, it's a lifestyle:  . The Mediterranean diet includes lifestyle factors typical of those in the region  . Foods, drinks and meals are best eaten with others and savored . Daily physical activity is important for overall good health . This could be strenuous exercise like running and aerobics . This could also be more leisurely activities such as walking, housework, yard-work, or taking the stairs . Moderation is the key; a balanced and healthy diet accommodates most foods and drinks . Consider portion sizes and frequency of consumption of certain foods   Meal Ideas & Options:  . Breakfast:  o Whole wheat toast or whole wheat English muffins with peanut butter & hard boiled egg o Steel cut oats topped with apples & cinnamon and skim milk  o Fresh fruit: banana, strawberries, melon, berries, peaches  o Smoothies: strawberries, bananas, greek yogurt, peanut butter o Low fat greek yogurt with blueberries and granola  o Egg white omelet with spinach and mushrooms o Breakfast couscous: whole wheat couscous, apricots, skim milk, cranberries  . Sandwiches:  o Hummus and grilled vegetables (peppers, zucchini, squash) on whole wheat bread   o Grilled chicken on whole wheat pita with lettuce, tomatoes, cucumbers or tzatziki  o Tuna salad on whole wheat bread: tuna salad made with greek  yogurt, olives, red peppers, capers, green onions o Garlic rosemary lamb pita: lamb sauted with garlic, rosemary, salt & pepper; add lettuce, cucumber, greek yogurt to pita - flavor with lemon juice and black pepper  . Seafood:  o Mediterranean grilled salmon, seasoned with garlic, basil, parsley, lemon juice and black pepper o Shrimp, lemon, and spinach whole-grain pasta salad made with low fat greek yogurt  o Seared scallops with lemon orzo  o Seared tuna steaks seasoned salt, pepper, coriander topped with tomato mixture of olives, tomatoes, olive oil, minced garlic, parsley, green onions and cappers  . Meats:  o Herbed greek chicken salad with kalamata olives, cucumber, feta  o Red bell peppers stuffed with spinach, bulgur, lean ground beef (or lentils) &  topped with feta   o Kebabs: skewers of chicken, tomatoes, onions, zucchini, squash  o [place] burgers: made with red onions, mint, dill, lemon juice, feta cheese topped with roasted red peppers . Vegetarian o Cucumber salad: cucumbers, artichoke hearts, celery, red onion, feta cheese, tossed in olive oil & lemon juice  o Hummus and whole grain pita points with a greek salad (lettuce, tomato, feta, olives, cucumbers, red onion) o Lentil soup with celery, carrots made with vegetable broth, garlic, salt and pepper  o Tabouli salad: parsley, bulgur, mint, scallions, cucumbers, tomato, radishes, lemon juice, olive oil, salt and pepper.        Atrial Septal Defect, Adult An atrial septal defect (ASD) is a hole in the heart. This hole is located in the thin tissue (septum) that separates the two upper chambers of the heart (right atrium and left atrium). This hole is present at birth (congenital). A few minutes after birth, this hole normally closes so that blood is not able to go between the right atrium and left atrium. Normally, blood from the right side of the heart is pumped to the lungs, where the blood has oxygen added to it (is  oxygenated). The oxygenated blood from the lungs is then pumped to the left side of the heart. From the left side of the heart, blood is pumped out to the rest of the body. When an ASD occurs, blood from the left atrium mixes with blood in the right atrium. The blood is then recirculated to the lungs and left side of the heart. In other words, the blood makes the trip twice. An ASD makes the heart work harder by increasing the amount of blood in the right side of the heart. This causes heart overload and eventually weakens the heart's ability to pump. What are the causes? The cause of this congenital condition is not known. What are the signs or symptoms? Symptoms of this condition include:  Tiredness or fatigue.  Trouble breathing or shortness of breath.  Irregular heartbeats (arrhythmias).  An extra "swishing" or "whooshing" sound (heart murmur) that is heard when listening to the heart.  How is this diagnosed? This condition is diagnosed based on the results of one or more of the following tests:  Electrocardiogram, ECG. This records the electrical activity of your heart and traces the patterns of your heartbeat.  Chest X-ray exam.  Echocardiogram. There are two types that may be used: ? Transthoracic echocardiogram (TTE). A TTE is very effective in detecting the two most common types of ASD, ostium primum or ostium secundum. It is not as sensitive in detecting a less common form of ASD, sinus venosus. ? Transesophageal echocardiogram (TEE). A TEE is especially helpful in patients who have a thin or easily movable (mobile) septum, making ASD detection more accurate.  MRI or CT scan.  Cardiac catheterization. In this procedure: ? A small tube (catheter) is passed through a large vein in your neck, groin, or arm. ? Your cardiologist visualizes your heart defect, checks how well your heart is pumping, and checks the function of your heart valves.  How is this treated? Treatment for  this condition depends on the size of the hole and the amount of blood that goes into the right atrium.  No treatment is required if only a small amount of blood is moving back and forth (shunting) from the left to right atrium.  Minimally invasive closure may be done depending on the type and location of the ASD. This  treatment is done in a cardiac catheterization lab. ? A catheter is inserted into a large blood vessel. ? The catheter is advanced to the ASD in the heart. ? A patch resembling an umbrella is threaded up the catheter and placed in the ASD hole. ? The patch is then "opened up" to close off the hole.  Open heart surgery may be necessary if minimally invasive closure cannot be done. In open heart surgery: ? If the ASD is small, the hole can be closed with stitches. ? If the ASD is large, a patch is sewn over the defect so the hole is closed.  Get help right away if:  You experience unusual fatigue when you exert yourself.  You have chest pain at rest or with exertion.  You notice your fingertips or lips turning pale or blue. This information is not intended to replace advice given to you by your health care provider. Make sure you discuss any questions you have with your health care provider. Document Released: 03/01/2004 Document Revised: 03/17/2016 Document Reviewed: 03/17/2016 Elsevier Interactive Patient Education  [employer].

## 2018-02-28 ENCOUNTER — Telehealth: Payer: Self-pay | Admitting: Family Medicine

## 2018-02-28 DIAGNOSIS — Z0000 Encounter for general adult medical examination without abnormal findings: Secondary | ICD-10-CM

## 2018-02-28 DIAGNOSIS — E785 Hyperlipidemia, unspecified: Secondary | ICD-10-CM

## 2018-02-28 DIAGNOSIS — Q211 Atrial septal defect, unspecified: Secondary | ICD-10-CM

## 2018-02-28 NOTE — Telephone Encounter (Signed)
Please advise Dr Fabian SharpPanosh if able to give lab orders prior to CPE, thanks.

## 2018-02-28 NOTE — Telephone Encounter (Signed)
Copied from CRM [phone]. Topic: General - Other >> Feb 28, 2018 10:55 AM Leafy Roobinson, Norma J wrote: Reason for CRM:pt is calling and has scheduled her physical for 06-06-18 and would like cpx lab order mail to home address . Pt works at [employer] and can get her labs drawn for free

## 2018-02-28 NOTE — Telephone Encounter (Signed)
Yes we have done this for the last few years   Dx  Preventive fisit , HLD   , atrial septal defect. cmp cbcdiff lipid and tsh

## 2018-03-06 NOTE — Telephone Encounter (Signed)
Labs ordered - printed and mailed to patient verified address Pt notified.  Nothing further needed.

## 2018-04-04 ENCOUNTER — Ambulatory Visit: Payer: Managed Care, Other (non HMO) | Admitting: Internal Medicine

## 2018-04-04 ENCOUNTER — Ambulatory Visit: Payer: Self-pay | Admitting: *Deleted

## 2018-04-04 ENCOUNTER — Encounter: Payer: Self-pay | Admitting: Internal Medicine

## 2018-04-04 VITALS — BP 102/62 | HR 70 | Temp 97.8°F | Wt 156.6 lb

## 2018-04-04 DIAGNOSIS — N939 Abnormal uterine and vaginal bleeding, unspecified: Secondary | ICD-10-CM

## 2018-04-04 DIAGNOSIS — Q211 Atrial septal defect, unspecified: Secondary | ICD-10-CM

## 2018-04-04 DIAGNOSIS — N950 Postmenopausal bleeding: Secondary | ICD-10-CM

## 2018-04-04 NOTE — Telephone Encounter (Signed)
Pt has arrived here in the office now to see Dr. Fabian Sharp.

## 2018-04-04 NOTE — Telephone Encounter (Signed)
Patient began experiencing constant mild vaginal bleeding this morning after not having a period for 4-5 years. LBM 04/03/18, Denies difficulty voiding. She is experiencing mild stomach cramping today. She had mild lower back discomfort earlier today but no longer. Appointment made with PCP this afternoon.  Reason for Disposition . Postmenopausal vaginal bleeding  Answer Assessment - Initial Assessment Questions 1. AMOUNT: "Describe the bleeding that you are having." "How much bleeding is there?"    - SPOTTING: spotting, or pinkish / brownish mucous discharge; does not fill panti-liner or pad    - MILD:  less than 1 pad / hour; less than patient's  menstrual bleeding when she still had menstrual periods   - MODERATE: 1-2 pads / hour; small-medium blood clots (e.g., pea, grape, small coin)    - SEVERE: soaking 2 or more pads/hour for 2 or more hours; bleeding not contained by pads or continuous red blood from vagina; large blood clots (e.g., golf ball, large coin)      Mild, Just started today at lunch. 2. ONSET: "When did the bleeding begin?" "Is it continuing now?"     Today,  And it is continueous 3. MENOPAUSE: "When was your last menstrual period?"      4-5 years 4. ABDOMINAL PAIN: "Do you have any pain?" "How bad is the pain?"  (e.g., Scale 1-10; mild, moderate, or severe)   - MILD (1-3): doesn't interfere with normal activities, abdomen soft and not tender to touch    - MODERATE (4-7): interferes with normal activities or awakens from sleep, tender to touch    - SEVERE (8-10): excruciating pain, doubled over, unable to do any normal activities      Mild-like cramping all around her stomach. 5. BLOOD THINNERS: "Do you take any blood thinners?" (e.g., Coumadin/warfarin, Pradaxa/dabigatran, aspirin)     none 6. HORMONES: "Are you taking any hormone medications, prescription or OTC?" (e.g., birth control pills, estrogen)     none 7. CAUSE: "What do you think is causing the bleeding?" (e.g.,  recent gyn surgery, recent gyn procedure; known bleeding disorder, uterine cancer)       none 8. HEMODYNAMIC STATUS: "Are you weak or feeling lightheaded?" If so, ask: "Can you stand and walk normally?"       None. 9. OTHER SYMPTOMS: "What other symptoms are you having with the bleeding?" (e.g., back pain, burning with urination, fever)     Mild back pain on lower left side earlier today but none since.  Protocols used: VAGINAL BLEEDING - POSTMENOPAUSAL-A-AH

## 2018-04-04 NOTE — Patient Instructions (Addendum)
  Will be   Sending you to     [facility] referral   Some one     Will contact you about appt.   Postmenopausal Bleeding Postmenopausal bleeding is any bleeding a woman has after she has entered into menopause. Menopause is the end of a woman's fertile years. After menopause, a woman no longer ovulates or has menstrual periods. Postmenopausal bleeding can be caused by various things. Any type of postmenopausal bleeding, even if it appears to be a typical menstrual period, is concerning. This should be evaluated by your health care provider. Any treatment will depend on the cause of the bleeding. Follow these instructions at home: Monitor your condition for any changes. The following actions may help to alleviate any discomfort you are experiencing:  Avoid the use of tampons and douches as directed by your health care provider.  Change your pads frequently.  Get regular pelvic exams and Pap tests.  Keep all follow-up appointments for diagnostic tests as directed by your health care provider.  Contact a health care provider if:  Your bleeding lasts more than 1 week.  You have abdominal pain.  You have bleeding with sexual intercourse. Get help right away if:  You have a fever, chills, headache, dizziness, muscle aches, and bleeding.  You have severe pain with bleeding.  You are passing blood clots.  You have bleeding and need more than 1 pad an hour.  You feel faint. This information is not intended to replace advice given to you by your health care provider. Make sure you discuss any questions you have with your health care provider. Document Released: 10/26/2005 Document Revised: 12/24/2015 Document Reviewed: 02/14/2013 Elsevier Interactive Patient Education  [employer].

## 2018-04-04 NOTE — Progress Notes (Signed)
Chief Complaint  Patient presents with  . Vaginal Bleeding    Pt states that she has been having vaginal bleeding since about noon today. Pt states that she did not feel herself bleeding, she noticed after urinating and saw blood in the toilet bowl and on her toilet paper. Pt stated that she got back from lunch about later and she was still bleeding. Pt put on a pad but but has not checked again since. Pt states that she is not "flowing" and blood is not heavy. Mild cramping, some discomfort.    HPI: Beth Romero 52 y.o. come in for acute   viist for  Vaginal  Bleeding like  menses after  amenorrhea 4-5 years    No recent hot flushes but had nl sx of menopause in past    Minimal cramps  But discomfort   = no bowel or urine changes  . remote hx of fibroids .     may be due for pap .  No new cv sx or bleeding  Has appt for cpx in November .   ROS: See pertinent positives and negatives per HPI.doesnt take meds  Declines  Flu vaccine and   Trying to  be more active walking and no sx  .   At this time  No syncope.  Past Medical History:  Diagnosis Date  . Abnormal Pap smear of cervix    rx cryo remote  has been normal  . Atrial septal defect    large echo 2008 and 2006  declines any surgical correction for belief reasons consutlts with [facility] . pt aware of risk of irreverible Pulm ht causind devility and death is possible without correction. declines surgery and is not a candidate for other procedures.   Marland Kitchen Hx of abnormal cervical Pap smear   . Migraine   . Murmur   . Spell of transient neurologic symptoms 12/07/2012  . Syncopal episodes     Family History  Problem Relation Age of Onset  . Breast cancer Mother   . Heart disease Unknown   . Other Unknown        step sister "hole in the heart"  . Other Son        SCFE    Social History   Socioeconomic History  . Marital status: Married    Spouse name: Not on file  . Number of children: Not on file  .  Years of education: Not on file  . Highest education level: Not on file  Occupational History  . Not on file  Social Needs  . Financial resource strain: Not on file  . Food insecurity:    Worry: Not on file    Inability: Not on file  . Transportation needs:    Medical: Not on file    Non-medical: Not on file  Tobacco Use  . Smoking status: Never Smoker  . Smokeless tobacco: Never Used  Substance and Sexual Activity  . Alcohol use: No  . Drug use: No  . Sexual activity: Not on file  Lifestyle  . Physical activity:    Days per week: Not on file    Minutes per session: Not on file  . Stress: Not on file  Relationships  . Social connections:    Talks on phone: Not on file    Gets together: Not on file    Attends religious service: Not on file    Active member of club or organization: Not on file  Attends meetings of clubs or organizations: Not on file    Relationship status: Not on file  Other Topics Concern  . 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 Vitamin D, Ergocalciferol, (DRISDOL) 50000 units CAPS capsule Take 1 capsule (50,000 Units total) by mouth every 7 (seven) days. (Patient not taking: Reported on 04/04/2018) 12 capsule 0   No facility-administered medications prior to visit.      EXAM:  BP 102/62 (BP Location: Right Arm, Patient Position: Sitting, Cuff Size: Normal)   Pulse 70   Temp 97.8 F (36.6 C) (Oral)   Wt 156 lb 9.6 oz (71 kg)   BMI 26.47 kg/m   Body mass index is 26.47 kg/m.  GENERAL: vitals reviewed and listed above,  alert, oriented, appears well hydrated and in no acute distress HEENT: atraumatic, conjunctiva  clear, no obvious abnormalities on inspection of external nose and ears  NECK: no obvious masses on inspection palpation  LUNGS: clear to auscultation bilaterally, no wheezes, rales or rhonchi, good air movement CV: HRRR, no clubbing cyanosis or  peripheral edema nl cap refill    Long systolic murmur with ocass premature beat    Upper sternal border   abd soft no masses felt  MS: moves all extremities without noticeable focal  abnormality PSYCH: pleasant and cooperative, no obvious depression or anxiety Lab Results  Component Value Date   WBC 6.8 05/17/2017   HGB 13.9 05/17/2017   HCT 42.5 05/17/2017   PLT 205 05/17/2017   GLUCOSE 79 05/17/2017   CHOL 215 (H) 05/17/2017   TRIG 80 05/17/2017   HDL 50 05/17/2017   LDLCALC 149 (H) 05/17/2017   ALT 14 05/17/2017   AST 19 05/17/2017   NA 138 05/17/2017   K 4.2 05/17/2017   CL 100 05/17/2017   CREATININE 0.90 05/17/2017   BUN 14 05/17/2017   CO2 23 05/17/2017   TSH 1.590 05/17/2017   INR 1.1 11/11/2008   HGBA1C  11/11/2008    5.7 (NOTE) The ADA recommends the following therapeutic goal for glycemic control related to Hgb A1c measurement: Goal of therapy: <6.5 Hgb A1c  Reference: American Diabetes Association: Clinical Practice Recommendations 2010, Diabetes Care, 2010, 33: (Suppl  1).   BP Readings from Last 3 Encounters:  04/04/18 102/62  05/24/17 102/78  05/18/16 102/80    ASSESSMENT AND PLAN:  Discussed the following assessment and plan:  Post-menopause bleeding - no other alarm sx  acts like period at this point referral for eval if excessive prolonged bleeding in interim contact us.  - Plan: Ambulatory referral to Gynecology  Vagina bleeding - Plan: Ambulatory referral to Gynecology  Atrial septal defect Poss period out ot time  But   Advise we need gyne consults   Poss ultrasound   To assess  Stable hemodynamically today      Expectant management.  If heavy bleeding   Or Prolonged in interim  Gyne referral  -Patient advised to return or notify health  care team  if  new concerns arise.  Patient Instructions   Will be   Sending you to     [facility] referral   Some one     Will contact you about appt.   Postmenopausal Bleeding Postmenopausal bleeding is any bleeding a woman has after she has entered into menopause. Menopause is the end of a woman's fertile years. After menopause, a woman no longer ovulates or has menstrual periods. Postmenopausal bleeding can be caused by various things. Any type of postmenopausal bleeding, even if it appears to be a typical menstrual period, is concerning. This should be evaluated by your health care provider. Any treatment will depend on the cause of the bleeding. Follow these instructions at home: Monitor your condition for any changes. The following actions may help to alleviate any discomfort you are experiencing:  Avoid the use of tampons and douches as directed by your health care provider.  Change your pads frequently.  Get regular pelvic exams and Pap tests.  Keep all follow-up appointments for diagnostic tests as directed by your health care provider.  Contact a health care provider if:  Your bleeding lasts more than 1 week.  You have abdominal pain.  You have bleeding with sexual intercourse. Get help right away if:  You have a fever, chills, headache, dizziness, muscle aches, and bleeding.  You have severe pain with bleeding.  You are passing blood clots.  You have bleeding and need more than 1 pad an hour.  You feel faint. This information is not intended to replace advice given to you by your health care provider. Make sure you discuss any questions you have with your health care provider. Document Released: 10/26/2005 Document Revised: 12/24/2015 Document Reviewed: 02/14/2013 Elsevier Interactive Patient Education  2018 [employer].     [place]. Yaden Seith M.D.

## 2018-04-09 ENCOUNTER — Ambulatory Visit: Payer: Managed Care, Other (non HMO) | Admitting: Gynecology

## 2018-04-09 ENCOUNTER — Encounter: Payer: Self-pay | Admitting: Gynecology

## 2018-04-09 VITALS — BP 126/82 | Ht 64.75 in | Wt 155.0 lb

## 2018-04-09 DIAGNOSIS — Z124 Encounter for screening for malignant neoplasm of cervix: Secondary | ICD-10-CM | POA: Diagnosis not present

## 2018-04-09 DIAGNOSIS — N950 Postmenopausal bleeding: Secondary | ICD-10-CM | POA: Diagnosis not present

## 2018-04-09 NOTE — Progress Notes (Signed)
    Beth Romero 1965/10/23 [id number]        52 y.o.  [id number] new patient who presents complaining of vaginal bleeding for several days starting last week.  Now resolved.  LMP reported 5 years ago.  Patient apparently underwent early menopause to include hot flushes and sweats which now have all resolved.  No bleeding since until now.  Having some cramping with this.  Also having some breast tenderness as she did premenstrually.  History of cryosurgery a number of years ago with normal Pap since.  No other significant gynecologic history.  Status post tubal sterilization in the past.  Past medical history,surgical history, problem list, medications, allergies, family history and social history were all reviewed and documented in the EPIC chart.  Directed ROS with pertinent positives and negatives documented in the history of present illness/assessment and plan.  Exam: Biomedical scientist Vitals:   04/09/18 1400  BP: 126/82  Weight: 155 lb (70.3 kg)  Height: 5' 4.75" (1.645 m)   General appearance:  Normal Abdomen soft nontender without masses guarding rebound Pelvic external BUS vagina normal with atrophic changes.  Cervix normal.  Uterus normal size midline mobile nontender.  Adnexa without masses or tenderness.  Rectal exam is normal.  Assessment/Plan:  52 y.o. [id number] with episode of postmenopausal bleeding in patient to underwent menopause early.  We discussed the differential to include escape ovulation, hormonal dysfunction, structural such as polyps, hyperplastic and endometrial cancer.  Recommend baseline FSH.  Start with sonohysterogram rule out intracavitary abnormalities.  Possible scenarios reviewed depending upon the results.  Patient will follow-up for lab work and sonohysterogram and then will go from there.  I spent a total of 20 face-to-face minutes with the patient, over 50% was spent counseling and coordination of care.     Dara Lords MD, 2:29 PM  04/09/2018

## 2018-04-09 NOTE — Addendum Note (Signed)
Addended by: Berna Spare A on: 04/09/2018 02:58 PM   Modules accepted: Orders

## 2018-04-09 NOTE — Patient Instructions (Signed)
Follow-up for the ultrasound as scheduled. 

## 2018-04-10 LAB — PAP IG W/ RFLX HPV ASCU

## 2018-04-10 LAB — FOLLICLE STIMULATING HORMONE: FSH: 57.3 m[IU]/mL

## 2018-04-12 ENCOUNTER — Telehealth: Payer: Self-pay | Admitting: *Deleted

## 2018-04-12 NOTE — Telephone Encounter (Signed)
Patient called was seen on 04/09/18 called c/o lower back discomfort and stomach pressure, no longer bleeding, no urinary symptoms, having normal bowel movements. States back pain is a dull ache and can be sharp sometimes. Using heating pad on back with help some, has not used any OTC,states it hurts to stand and walk sometimes as well with back discomfort. Patient wanted to know if you have any recommendations?  Please advise

## 2018-04-12 NOTE — Telephone Encounter (Signed)
Ibuprofen 400 to 600 mg every 6 hours as needed 

## 2018-04-12 NOTE — Telephone Encounter (Signed)
Patient informed. 

## 2018-04-17 ENCOUNTER — Other Ambulatory Visit: Payer: Self-pay | Admitting: Gynecology

## 2018-04-17 DIAGNOSIS — N950 Postmenopausal bleeding: Secondary | ICD-10-CM

## 2018-04-19 ENCOUNTER — Encounter: Payer: Self-pay | Admitting: Gynecology

## 2018-04-19 ENCOUNTER — Ambulatory Visit (INDEPENDENT_AMBULATORY_CARE_PROVIDER_SITE_OTHER): Payer: Managed Care, Other (non HMO)

## 2018-04-19 ENCOUNTER — Ambulatory Visit: Payer: Managed Care, Other (non HMO) | Admitting: Gynecology

## 2018-04-19 VITALS — BP 122/80

## 2018-04-19 DIAGNOSIS — N950 Postmenopausal bleeding: Secondary | ICD-10-CM

## 2018-04-19 NOTE — Patient Instructions (Signed)
Office will call with biopsy results 

## 2018-04-19 NOTE — Progress Notes (Signed)
    Beth Romero 09/20/1965 [id number]        52 y.o.  [id number] presents for sonohysterogram due to episode of postmenopausal bleeding.  Patient was 5 years past her last menstrual period and had several days of vaginal bleeding the beginning of September.  It has now resolved.  Was having some cramping with it which also has resolved.  FSH returned 57.  Past medical history,surgical history, problem list, medications, allergies, family history and social history were all reviewed and documented in the EPIC chart.  Directed ROS with pertinent positives and negatives documented in the history of present illness/assessment and plan.  Exam: Marlou StarksPam Summers assistant BP 122/80 General appearance:  Normal Abdomen soft nontender without masses guarding rebound Pelvic external BUS vagina with atrophic changes.  Cervix with atrophic changes.  Uterus normal size midline mobile nontender.  Adnexa without masses or tenderness.  Ultrasound: Transvaginal and transabdominal shows uterus normal size and echotexture.  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  Endometrial echo 2.2 mm.  Right and left ovaries normal.  Small left paraovarian cyst noted at 10 x 6 mm.  Cul-de-sac negative.  Sonohysterogram: Performed under sterile technique, single-tooth tenaculum anterior lip of the cervix was required for catheter placement.  There was good distention with no abnormalities.  Endometrial sample taken.  Patient tolerated well.  Assessment/Plan:  52 y.o. [name] with single episode of postmenopausal bleeding.  Endometrial echo thin.  Sonohysterogram without defects.  Endometrial biopsy taken.  Patient will follow-up for results.  Reviewed differential to include possible escape ovulation versus atrophic breakthrough bleeding.  Will monitor for now and if any further bleeding she will call.    Dara Lordsimothy P Simar Pothier MD, 2:41 PM 04/19/2018

## 2018-04-19 NOTE — Addendum Note (Signed)
Addended by: Dayna BarkerGARDNER, KIMBERLY K on: 04/19/2018 02:54 PM   Modules accepted: Orders

## 2018-06-05 NOTE — Progress Notes (Signed)
Chief Complaint  Patient presents with  . Annual Exam    HPI: Patient  Beth Romero  52 y.o. comes in today for Preventive Health Care visit  Saw dr Phineas Real  For pmb  And all negative  To call if returns  utd on pap etc .  Will get mammogra  Declines immuniz and colon screen   No cp sob   Change in exercise tolerazcne  declining any intervnetion for her asd   righ shoulder pain with hard to lift  Had fallone trip dress left arm better   Is right handed no numbness using a topical ocass  Health Maintenance  Topic Date Due  . HIV Screening  05/07/1983  . MAMMOGRAM  09/01/2018 (Originally 05/06/2018)  . INFLUENZA VACCINE  10/30/2018 (Originally 03/01/2018)  . COLONOSCOPY  06/07/2019 (Originally 05/06/2018)  . PAP SMEAR  04/09/2021  . TETANUS/TDAP  07/05/2021   Health Maintenance Review LIFESTYLE:  Exercise:    Walking a lot  5 k step .  Tobacco/ETS: no Alcohol:  no Sugar beverages:  Sometimes  Sleep: 9 - 5  Drug use: no HH of   4  grN. Work: LESS THAN 40  Hours   working on [employer]  2 more classes  [place] child due this month    ROS:  GEN/ HEENT: No fever, significant weight changes sweats headaches vision problems hearing changes, CV/ PULM; No chest pain shortness of breath cough, syncope,edema  change in exercise tolerance. GI /GU: No adominal pain, vomiting, change in bowel habits. No blood in the stool. No significant GU symptoms. SKIN/HEME: ,no acute skin rashes suspicious lesions or bleeding. No lymphadenopathy, nodules, masses.  NEURO/ PSYCH:  No neurologic signs such as weakness numbness. No depression anxiety. IMM/ Allergy: No unusual infections.  Allergy .   REST of 12 system review negative except as per HPI   Past Medical History:  Diagnosis Date  . Abnormal Pap smear of cervix    rx cryo remote  has been normal  . Atrial septal defect    large echo 2008 and 2006  declines any surgical correction for belief reasons consutlts with [facility] . pt  aware of risk of irreverible Pulm ht causind devility and death is possible without correction. declines surgery and is not a candidate for other procedures.   Marland Kitchen Hx of abnormal cervical Pap smear   . Migraine   . Murmur   . Spell of transient neurologic symptoms 12/07/2012  . Syncopal episodes     Past Surgical History:  Procedure Laterality Date  . CARDIAC CATHETERIZATION     Summer 2009 [facility] large ASD 2:1 shunt  . Hosp  11/11/11   [facility] weakness r/o TIA neg eval  . TUBAL LIGATION    . US ECHOCARDIOGRAPHY     2008 and 2006    Family History  Problem Relation Age of Onset  . Breast cancer Mother   . Heart disease Unknown   . Other Unknown        step sister "hole in the heart"  . Other Son        SCFE  . Hypertension Maternal Grandmother     Social History   Socioeconomic History  . Marital status: Married    Spouse name: Not on file  . Number of children: Not on file  . Years of education: Not on file  . Highest education level: Not on file  Occupational History  . Not on file  Social Needs  .  Financial resource strain: Not on file  . Food insecurity:    Worry: Not on file    Inability: Not on file  . Transportation needs:    Medical: Not on file    Non-medical: Not on file  Tobacco Use  . Smoking status: Never Smoker  . Smokeless tobacco: Never Used  Substance and Sexual Activity  . Alcohol use: No  . Drug use: No  . Sexual activity: Not Currently    Comment: 1st intercourse- 18, partners- 4, divorced  Lifestyle  . Physical activity:    Days per week: Not on file    Minutes per session: Not on file  . Stress: Not on file  Relationships  . Social connections:    Talks on phone: Not on file    Gets together: Not on file    Attends religious service: Not on file    Active member of club or organization: Not on file    Attends meetings of clubs or organizations: Not on file    Relationship status: Not on file  Other Topics Concern  . Not on file    Social History Narrative   Occupation: works at [employer] 40 hours week [place] getting a degree  Only has one more year  To graduate  Work and school   Married   Husband currently unemployed   Regular exercise- no   HH of 4   No pets   Church on [place] doesn't celebrate holidays   Doesn't do animal products and " natural"     No outpatient medications prior to visit.   No facility-administered medications prior to visit.      EXAM:  BP 110/68 (BP Location: Left Arm, Patient Position: Sitting, Cuff Size: Normal)   Pulse 93   Temp 98.1 F (36.7 C) (Oral)   Ht '5\' 5"'  (1.651 m)   Wt 150 lb 12.8 oz (68.4 kg)   LMP 08/01/2012   SpO2 96%   BMI 25.09 kg/m   Body mass index is 25.09 kg/m. Wt Readings from Last 3 Encounters:  06/06/18 150 lb 12.8 oz (68.4 kg)  04/09/18 155 lb (70.3 kg)  04/04/18 156 lb 9.6 oz (71 kg)    Physical Exam: Vital signs reviewed ZES:PQZR is a well-developed well-nourished alert cooperative    who appearsr stated age in no acute distress.  HEENT: normocephalic atraumatic , Eyes: PERRL EOM's full, conjunctiva clear, Nares: paten,t no deformity discharge or tenderness., Ears: no deformity EAC's clear TMs with normal landmarks. Mouth: clear OP, no lesions, edema.  Moist mucous membranes. Dentition in adequate repair. NECK: supple without masses, thyromegaly or bruits. CHEST/PULM:  Clear to auscultation and percussion breath sounds equal no wheeze , rales or rhonchi. No chest wall deformities or tenderness. Breast: normal by inspection . No dimpling, discharge, masses, tenderness or discharge . CV: PMI is diffuse  S1 S2 loud 3+/6 syts m usb and lsb  No click ? If inc p2   no, rubs. Peripheral pulses are full without delay.No JVD .  ocass  Premature beat  No edema jvd  ABDOMEN: Bowel sounds normal nontender  No guard or rebound, no hepato splenomegal no CVA tenderness.  No hernia. Extremtities:  No clubbing  cyanosis or edema, no acute joint swelling or redness no focal atrophy right shoulder  Pain with elevated but  Good passive rom  NEURO:  Oriented x3, cranial nerves 3-12 appear to be intact, no obvious focal  weakness,gait within normal limits no abnormal reflexes or asymmetrical SKIN: No acute rashes normal turgor, color, no bruising or petechiae. PSYCH: Oriented, good eye contact, no obvious depression anxiety, cognition and judgment appear normal. LN: no cervical axillary inguinal adenopathy  Lab results are pending  BP Readings from Last 3 Encounters:  06/06/18 110/68  04/19/18 122/80  04/09/18 126/82      ASSESSMENT AND PLAN:  Discussed the following assessment and plan:  Visit for preventive health examination  ATRIAL SEPTAL DEFECT  Right shoulder tendinitis  Influenza vaccination declined  Colon cancer screening declined Still declining   intervention for asd and fortunately  asymptomatic at this time  Echo  done last year shows some rv  dilatation  And in vp but rest nl and no tv.   Conveyed my concern that this condition untreated can shorten her life if and when she gets pulmonary hypertension patient  aware .  Declined colon screen  And aware of benefit risk   Will  get mammogram  shoulder seems like bursitis  Or rc.   tendinitis   Conservativerx  Want to avoid frozen shoulder  Consider sm pt etc if needed  Patient Care Team: Burnis Medin, MD as PCP - General Patient Instructions  Get  Your mammogram 'and labs to [place] .  Ice after activity  Heat  For stretch    dont do the exercises if painful.  Will let y ou know when labs  Back .     Preventive Care 40-64 Years, Female Preventive care refers to lifestyle choices and visits with your health care provider that can promote health and wellness. What does preventive care include?  A yearly physical exam. This is also called an annual well check.  Dental exams once or twice a year.  Routine eye exams. Ask your  health care provider how often you should have your eyes checked.  Personal lifestyle choices, including: ? Daily care of your teeth and gums. ? Regular physical activity. ? Eating a healthy diet. ? Avoiding tobacco and drug use. ? Limiting alcohol use. ? Practicing safe sex. ? Taking low-dose aspirin daily starting at age 53. ? Taking vitamin and mineral supplements as recommended by your health care provider. What happens during an annual well check? The services and screenings done by your health care provider during your annual well check will depend on your age, overall health, lifestyle risk factors, and family history of disease. Counseling Your health care provider may ask you questions about your:  Alcohol use.  Tobacco use.  Drug use.  Emotional well-being.  Home and relationship well-being.  Sexual activity.  Eating habits.  Work and work Statistician.  Method of birth control.  Menstrual cycle.  Pregnancy history.  Screening You may have the following tests or measurements:  Height, weight, and BMI.  Blood pressure.  Lipid and cholesterol levels. These may be checked every 5 years, or more frequently if you are over 83 years old.  Skin check.  Lung cancer screening. You may have this screening every year starting at age 21 if you have a 30-pack-year history of smoking and currently smoke or have quit within the past 15 years.  Fecal occult blood test (FOBT) of the stool. You may have this test every year starting at age 44.  Flexible sigmoidoscopy or colonoscopy. You may have a sigmoidoscopy every 5 years or a colonoscopy every 10 years starting at age 72.  Hepatitis C blood test.  Hepatitis B blood  test.  Sexually transmitted disease (STD) testing.  Diabetes screening. This is done by checking your blood sugar (glucose) after you have not eaten for a while (fasting). You may have this done every 1-3 years.  Mammogram. This may be done every  1-2 years. Talk to your health care provider about when you should start having regular mammograms. This may depend on whether you have a family history of breast cancer.  BRCA-related cancer screening. This may be done if you have a family history of breast, ovarian, tubal, or peritoneal cancers.  Pelvic exam and Pap test. This may be done every 3 years starting at age 8. Starting at age 6, this may be done every 5 years if you have a Pap test in combination with an HPV test.  Bone density scan. This is done to screen for osteoporosis. You may have this scan if you are at high risk for osteoporosis.  Discuss your test results, treatment options, and if necessary, the need for more tests with your health care provider. Vaccines Your health care provider may recommend certain vaccines, such as:  Influenza vaccine. This is recommended every year.  Tetanus, diphtheria, and acellular pertussis (Tdap, Td) vaccine. You may need a Td booster every 10 years.  Varicella vaccine. You may need this if you have not been vaccinated.  Zoster vaccine. You may need this after age 16.  Measles, mumps, and rubella (MMR) vaccine. You may need at least one dose of MMR if you were born in 1957 or later. You may also need a second dose.  Pneumococcal 13-valent conjugate (PCV13) vaccine. You may need this if you have certain conditions and were not previously vaccinated.  Pneumococcal polysaccharide (PPSV23) vaccine. You may need one or two doses if you smoke cigarettes or if you have certain conditions.  Meningococcal vaccine. You may need this if you have certain conditions.  Hepatitis A vaccine. You may need this if you have certain conditions or if you travel or work in places where you may be exposed to hepatitis A.  Hepatitis B vaccine. You may need this if you have certain conditions or if you travel or work in places where you may be exposed to hepatitis B.  Haemophilus influenzae type b (Hib)  vaccine. You may need this if you have certain conditions.  Talk to your health care provider about which screenings and vaccines you need and how often you need them. This information is not intended to replace advice given to you by your health care provider. Make sure you discuss any questions you have with your health care provider. Document Released: 08/14/2015 Document Revised: 04/06/2016 Document Reviewed: 05/19/2015 Elsevier Interactive Patient Education  2018 [employer].   Shoulder Exercises Ask our health care provider which exercises are safe for you. Do exercises exactly as told by your health care provider and adjust them as directed. It is normal to feel mild stretching, pulling, tightness, or discomfort as you do these exercises, but you should stop right away if you feel sudden pain or your pain gets worse.Do not begin these exercises until told by your health care provider. RANGE OF MOTION EXERCISES These exercises warm up your muscles and joints and improve the movement and flexibility of your shoulder. These exercises also help to relieve pain, numbness, and tingling. These exercises involve stretching your injured shoulder directly. Exercise A: Pendulum  1. Stand near a wall or a surface that you can hold onto for balance. 2. Bend at the waist  and let your left / right arm hang straight down. Use your other arm to support you. Keep your back straight and do not lock your knees. 3. Relax your left / right arm and shoulder muscles, and move your hips and your trunk so your left / right arm swings freely. Your arm should swing because of the motion of your body, not because you are using your arm or shoulder muscles. 4. Keep moving your body so your arm swings in the following directions, as told by your health care provider: ? Side to side. ? Forward and backward. ? In clockwise and counterclockwise circles. 5. Continue each motion for __________ seconds, or for as long as  told by your health care provider. 6. Slowly return to the starting position. Repeat __________ times. Complete this exercise __________ times a day. Exercise B:Flexion, Standing  1. Stand and hold a broomstick, a cane, or a similar object. Place your hands a little more than shoulder-width apart on the object. Your left / right hand should be palm-up, and your other hand should be palm-down. 2. Keep your elbow straight and keep your shoulder muscles relaxed. Push the stick down with your healthy arm to raise your left / right arm in front of your body, and then over your head until you feel a stretch in your shoulder. ? Avoid shrugging your shoulder while you raise your arm. Keep your shoulder blade tucked down toward the middle of your back. 3. Hold for __________ seconds. 4. Slowly return to the starting position. Repeat __________ times. Complete this exercise __________ times a day. Exercise C: Abduction, Standing 1. Stand and hold a broomstick, a cane, or a similar object. Place your hands a little more than shoulder-width apart on the object. Your left / right hand should be palm-up, and your other hand should be palm-down. 2. While keeping your elbow straight and your shoulder muscles relaxed, push the stick across your body toward your left / right side. Raise your left / right arm to the side of your body and then over your head until you feel a stretch in your shoulder. ? Do not raise your arm above shoulder height, unless your health care provider tells you to do that. ? Avoid shrugging your shoulder while you raise your arm. Keep your shoulder blade tucked down toward the middle of your back. 3. Hold for __________ seconds. 4. Slowly return to the starting position. Repeat __________ times. Complete this exercise __________ times a day. Exercise D:Internal Rotation  1. Place your left / right hand behind your back, palm-up. 2. Use your other hand to dangle an exercise band, a  towel, or a similar object over your shoulder. Grasp the band with your left / right hand so you are holding onto both ends. 3. Gently pull up on the band until you feel a stretch in the front of your left / right shoulder. ? Avoid shrugging your shoulder while you raise your arm. Keep your shoulder blade tucked down toward the middle of your back. 4. Hold for __________ seconds. 5. Release the stretch by letting go of the band and lowering your hands. Repeat __________ times. Complete this exercise __________ times a day. STRETCHING EXERCISES These exercises warm up your muscles and joints and improve the movement and flexibility of your shoulder. These exercises also help to relieve pain, numbness, and tingling. These exercises are done using your healthy shoulder to help stretch the muscles of your injured shoulder. Exercise E: Corner  Stretch (External Rotation and Abduction)  1. Stand in a doorway with one of your feet slightly in front of the other. This is called a staggered stance. If you cannot reach your forearms to the door frame, stand facing a corner of a room. 2. Choose one of the following positions as told by your health care provider: ? Place your hands and forearms on the door frame above your head. ? Place your hands and forearms on the door frame at the height of your head. ? Place your hands on the door frame at the height of your elbows. 3. Slowly move your weight onto your front foot until you feel a stretch across your chest and in the front of your shoulders. Keep your head and chest upright and keep your abdominal muscles tight. 4. Hold for __________ seconds. 5. To release the stretch, shift your weight to your back foot. Repeat __________ times. Complete this stretch __________ times a day. Exercise F:Extension, Standing 1. Stand and hold a broomstick, a cane, or a similar object behind your back. ? Your hands should be a little wider than shoulder-width  apart. ? Your palms should face away from your back. 2. Keeping your elbows straight and keeping your shoulder muscles relaxed, move the stick away from your body until you feel a stretch in your shoulder. ? Avoid shrugging your shoulders while you move the stick. Keep your shoulder blade tucked down toward the middle of your back. 3. Hold for __________ seconds. 4. Slowly return to the starting position. Repeat __________ times. Complete this exercise __________ times a day. STRENGTHENING EXERCISES These exercises build strength and endurance in your shoulder. Endurance is the ability to use your muscles for a long time, even after they get tired. Exercise G:External Rotation  1. Sit in a stable chair without armrests. 2. Secure an exercise band at elbow height on your left / right side. 3. Place a soft object, such as a folded towel or a small pillow, between your left / right upper arm and your body to move your elbow a few inches away (about 10 cm) from your side. 4. Hold the end of the band so it is tight and there is no slack. 5. Keeping your elbow pressed against the soft object, move your left / right forearm out, away from your abdomen. Keep your body steady so only your forearm moves. 6. Hold for __________ seconds. 7. Slowly return to the starting position. Repeat __________ times. Complete this exercise __________ times a day. Exercise H:Shoulder Abduction  1. Sit in a stable chair without armrests, or stand. 2. Hold a __________ weight in your left / right hand, or hold an exercise band with both hands. 3. Start with your arms straight down and your left / right palm facing in, toward your body. 4. Slowly lift your left / right hand out to your side. Do not lift your hand above shoulder height unless your health care provider tells you that this is safe. ? Keep your arms straight. ? Avoid shrugging your shoulder while you do this movement. Keep your shoulder blade tucked down  toward the middle of your back. 5. Hold for __________ seconds. 6. Slowly lower your arm, and return to the starting position. Repeat __________ times. Complete this exercise __________ times a day. Exercise I:Shoulder Extension 1. Sit in a stable chair without armrests, or stand. 2. Secure an exercise band to a stable object in front of you where it is at shoulder  height. 3. Hold one end of the exercise band in each hand. Your palms should face each other. 4. Straighten your elbows and lift your hands up to shoulder height. 5. Step back, away from the secured end of the exercise band, until the band is tight and there is no slack. 6. Squeeze your shoulder blades together as you pull your hands down to the sides of your thighs. Stop when your hands are straight down by your sides. Do not let your hands go behind your body. 7. Hold for __________ seconds. 8. Slowly return to the starting position. Repeat __________ times. Complete this exercise __________ times a day. Exercise J:Standing Shoulder Row 1. Sit in a stable chair without armrests, or stand. 2. Secure an exercise band to a stable object in front of you so it is at waist height. 3. Hold one end of the exercise band in each hand. Your palms should be in a thumbs-up position. 4. Bend each of your elbows to an "L" shape (about 90 degrees) and keep your upper arms at your sides. 5. Step back until the band is tight and there is no slack. 6. Slowly pull your elbows back behind you. 7. Hold for __________ seconds. 8. Slowly return to the starting position. Repeat __________ times. Complete this exercise __________ times a day. Exercise K:Shoulder Press-Ups  1. Sit in a stable chair that has armrests. Sit upright, with your feet flat on the floor. 2. Put your hands on the armrests so your elbows are bent and your fingers are pointing forward. Your hands should be about even with the sides of your body. 3. Push down on the armrests  and use your arms to lift yourself off of the chair. Straighten your elbows and lift yourself up as much as you comfortably can. ? Move your shoulder blades down, and avoid letting your shoulders move up toward your ears. ? Keep your feet on the ground. As you get stronger, your feet should support less of your body weight as you lift yourself up. 4. Hold for __________ seconds. 5. Slowly lower yourself back into the chair. Repeat __________ times. Complete this exercise __________ times a day. Exercise L: Wall Push-Ups  1. Stand so you are facing a stable wall. Your feet should be about one arm-length away from the wall. 2. Lean forward and place your palms on the wall at shoulder height. 3. Keep your feet flat on the floor as you bend your elbows and lean forward toward the wall. 4. Hold for __________ seconds. 5. Straighten your elbows to push yourself back to the starting position. Repeat __________ times. Complete this exercise __________ times a day. This information is not intended to replace advice given to you by your health care provider. Make sure you discuss any questions you have with your health care provider. Document Released: 06/01/2005 Document Revised: 04/11/2016 Document Reviewed: 03/29/2015 Elsevier Interactive Patient Education  2018 [employer].  M.D.

## 2018-06-06 ENCOUNTER — Encounter: Payer: Self-pay | Admitting: Internal Medicine

## 2018-06-06 ENCOUNTER — Ambulatory Visit (INDEPENDENT_AMBULATORY_CARE_PROVIDER_SITE_OTHER): Payer: Managed Care, Other (non HMO) | Admitting: Internal Medicine

## 2018-06-06 VITALS — BP 110/68 | HR 93 | Temp 98.1°F | Ht 65.0 in | Wt 150.8 lb

## 2018-06-06 DIAGNOSIS — M778 Other enthesopathies, not elsewhere classified: Secondary | ICD-10-CM

## 2018-06-06 DIAGNOSIS — Z5320 Procedure and treatment not carried out because of patient's decision for unspecified reasons: Secondary | ICD-10-CM | POA: Diagnosis not present

## 2018-06-06 DIAGNOSIS — Z0000 Encounter for general adult medical examination without abnormal findings: Secondary | ICD-10-CM | POA: Diagnosis not present

## 2018-06-06 DIAGNOSIS — Q2111 Secundum atrial septal defect: Secondary | ICD-10-CM

## 2018-06-06 DIAGNOSIS — Q211 Atrial septal defect: Secondary | ICD-10-CM | POA: Diagnosis not present

## 2018-06-06 DIAGNOSIS — M7581 Other shoulder lesions, right shoulder: Secondary | ICD-10-CM | POA: Diagnosis not present

## 2018-06-06 DIAGNOSIS — Z2821 Immunization not carried out because of patient refusal: Secondary | ICD-10-CM

## 2018-06-06 NOTE — Patient Instructions (Addendum)
Get  Your mammogram 'and labs to [place] .  Ice after activity  Heat  For stretch    dont do the exercises if painful.  Will let y ou know when labs  Back .     Preventive Care 40-64 Years, Female Preventive care refers to lifestyle choices and visits with your health care provider that can promote health and wellness. What does preventive care include?  A yearly physical exam. This is also called an annual well check.  Dental exams once or twice a year.  Routine eye exams. Ask your health care provider how often you should have your eyes checked.  Personal lifestyle choices, including: ? Daily care of your teeth and gums. ? Regular physical activity. ? Eating a healthy diet. ? Avoiding tobacco and drug use. ? Limiting alcohol use. ? Practicing safe sex. ? Taking low-dose aspirin daily starting at age 19. ? Taking vitamin and mineral supplements as recommended by your health care provider. What happens during an annual well check? The services and screenings done by your health care provider during your annual well check will depend on your age, overall health, lifestyle risk factors, and family history of disease. Counseling Your health care provider may ask you questions about your:  Alcohol use.  Tobacco use.  Drug use.  Emotional well-being.  Home and relationship well-being.  Sexual activity.  Eating habits.  Work and work Statistician.  Method of birth control.  Menstrual cycle.  Pregnancy history.  Screening You may have the following tests or measurements:  Height, weight, and BMI.  Blood pressure.  Lipid and cholesterol levels. These may be checked every 5 years, or more frequently if you are over 28 years old.  Skin check.  Lung cancer screening. You may have this screening every year starting at age 15 if you have a 30-pack-year history of smoking and currently smoke or have quit within the past 15 years.  Fecal occult blood test (FOBT) of the  stool. You may have this test every year starting at age 53.  Flexible sigmoidoscopy or colonoscopy. You may have a sigmoidoscopy every 5 years or a colonoscopy every 10 years starting at age 58.  Hepatitis C blood test.  Hepatitis B blood test.  Sexually transmitted disease (STD) testing.  Diabetes screening. This is done by checking your blood sugar (glucose) after you have not eaten for a while (fasting). You may have this done every 1-3 years.  Mammogram. This may be done every 1-2 years. Talk to your health care provider about when you should start having regular mammograms. This may depend on whether you have a family history of breast cancer.  BRCA-related cancer screening. This may be done if you have a family history of breast, ovarian, tubal, or peritoneal cancers.  Pelvic exam and Pap test. This may be done every 3 years starting at age 29. Starting at age 19, this may be done every 5 years if you have a Pap test in combination with an HPV test.  Bone density scan. This is done to screen for osteoporosis. You may have this scan if you are at high risk for osteoporosis.  Discuss your test results, treatment options, and if necessary, the need for more tests with your health care provider. Vaccines Your health care provider may recommend certain vaccines, such as:  Influenza vaccine. This is recommended every year.  Tetanus, diphtheria, and acellular pertussis (Tdap, Td) vaccine. You may need a Td booster every 10 years.  Varicella  vaccine. You may need this if you have not been vaccinated.  Zoster vaccine. You may need this after age 34.  Measles, mumps, and rubella (MMR) vaccine. You may need at least one dose of MMR if you were born in 1957 or later. You may also need a second dose.  Pneumococcal 13-valent conjugate (PCV13) vaccine. You may need this if you have certain conditions and were not previously vaccinated.  Pneumococcal polysaccharide (PPSV23) vaccine. You  may need one or two doses if you smoke cigarettes or if you have certain conditions.  Meningococcal vaccine. You may need this if you have certain conditions.  Hepatitis A vaccine. You may need this if you have certain conditions or if you travel or work in places where you may be exposed to hepatitis A.  Hepatitis B vaccine. You may need this if you have certain conditions or if you travel or work in places where you may be exposed to hepatitis B.  Haemophilus influenzae type b (Hib) vaccine. You may need this if you have certain conditions.  Talk to your health care provider about which screenings and vaccines you need and how often you need them. This information is not intended to replace advice given to you by your health care provider. Make sure you discuss any questions you have with your health care provider. Document Released: 08/14/2015 Document Revised: 04/06/2016 Document Reviewed: 05/19/2015 Elsevier Interactive Patient Education  2018 [employer].   Shoulder Exercises Ask our health care provider which exercises are safe for you. Do exercises exactly as told by your health care provider and adjust them as directed. It is normal to feel mild stretching, pulling, tightness, or discomfort as you do these exercises, but you should stop right away if you feel sudden pain or your pain gets worse.Do not begin these exercises until told by your health care provider. RANGE OF MOTION EXERCISES These exercises warm up your muscles and joints and improve the movement and flexibility of your shoulder. These exercises also help to relieve pain, numbness, and tingling. These exercises involve stretching your injured shoulder directly. Exercise A: Pendulum  1. Stand near a wall or a surface that you can hold onto for balance. 2. Bend at the waist and let your left / right arm hang straight down. Use your other arm to support you. Keep your back straight and do not lock your knees. 3. Relax  your left / right arm and shoulder muscles, and move your hips and your trunk so your left / right arm swings freely. Your arm should swing because of the motion of your body, not because you are using your arm or shoulder muscles. 4. Keep moving your body so your arm swings in the following directions, as told by your health care provider: ? Side to side. ? Forward and backward. ? In clockwise and counterclockwise circles. 5. Continue each motion for __________ seconds, or for as long as told by your health care provider. 6. Slowly return to the starting position. Repeat __________ times. Complete this exercise __________ times a day. Exercise B:Flexion, Standing  1. Stand and hold a broomstick, a cane, or a similar object. Place your hands a little more than shoulder-width apart on the object. Your left / right hand should be palm-up, and your other hand should be palm-down. 2. Keep your elbow straight and keep your shoulder muscles relaxed. Push the stick down with your healthy arm to raise your left / right arm in front of your body,  and then over your head until you feel a stretch in your shoulder. ? Avoid shrugging your shoulder while you raise your arm. Keep your shoulder blade tucked down toward the middle of your back. 3. Hold for __________ seconds. 4. Slowly return to the starting position. Repeat __________ times. Complete this exercise __________ times a day. Exercise C: Abduction, Standing 1. Stand and hold a broomstick, a cane, or a similar object. Place your hands a little more than shoulder-width apart on the object. Your left / right hand should be palm-up, and your other hand should be palm-down. 2. While keeping your elbow straight and your shoulder muscles relaxed, push the stick across your body toward your left / right side. Raise your left / right arm to the side of your body and then over your head until you feel a stretch in your shoulder. ? Do not raise your arm above  shoulder height, unless your health care provider tells you to do that. ? Avoid shrugging your shoulder while you raise your arm. Keep your shoulder blade tucked down toward the middle of your back. 3. Hold for __________ seconds. 4. Slowly return to the starting position. Repeat __________ times. Complete this exercise __________ times a day. Exercise D:Internal Rotation  1. Place your left / right hand behind your back, palm-up. 2. Use your other hand to dangle an exercise band, a towel, or a similar object over your shoulder. Grasp the band with your left / right hand so you are holding onto both ends. 3. Gently pull up on the band until you feel a stretch in the front of your left / right shoulder. ? Avoid shrugging your shoulder while you raise your arm. Keep your shoulder blade tucked down toward the middle of your back. 4. Hold for __________ seconds. 5. Release the stretch by letting go of the band and lowering your hands. Repeat __________ times. Complete this exercise __________ times a day. STRETCHING EXERCISES These exercises warm up your muscles and joints and improve the movement and flexibility of your shoulder. These exercises also help to relieve pain, numbness, and tingling. These exercises are done using your healthy shoulder to help stretch the muscles of your injured shoulder. Exercise E: Warehouse manager (External Rotation and Abduction)  1. Stand in a doorway with one of your feet slightly in front of the other. This is called a staggered stance. If you cannot reach your forearms to the door frame, stand facing a corner of a room. 2. Choose one of the following positions as told by your health care provider: ? Place your hands and forearms on the door frame above your head. ? Place your hands and forearms on the door frame at the height of your head. ? Place your hands on the door frame at the height of your elbows. 3. Slowly move your weight onto your front foot until  you feel a stretch across your chest and in the front of your shoulders. Keep your head and chest upright and keep your abdominal muscles tight. 4. Hold for __________ seconds. 5. To release the stretch, shift your weight to your back foot. Repeat __________ times. Complete this stretch __________ times a day. Exercise F:Extension, Standing 1. Stand and hold a broomstick, a cane, or a similar object behind your back. ? Your hands should be a little wider than shoulder-width apart. ? Your palms should face away from your back. 2. Keeping your elbows straight and keeping your shoulder muscles relaxed, move the  stick away from your body until you feel a stretch in your shoulder. ? Avoid shrugging your shoulders while you move the stick. Keep your shoulder blade tucked down toward the middle of your back. 3. Hold for __________ seconds. 4. Slowly return to the starting position. Repeat __________ times. Complete this exercise __________ times a day. STRENGTHENING EXERCISES These exercises build strength and endurance in your shoulder. Endurance is the ability to use your muscles for a long time, even after they get tired. Exercise G:External Rotation  1. Sit in a stable chair without armrests. 2. Secure an exercise band at elbow height on your left / right side. 3. Place a soft object, such as a folded towel or a small pillow, between your left / right upper arm and your body to move your elbow a few inches away (about 10 cm) from your side. 4. Hold the end of the band so it is tight and there is no slack. 5. Keeping your elbow pressed against the soft object, move your left / right forearm out, away from your abdomen. Keep your body steady so only your forearm moves. 6. Hold for __________ seconds. 7. Slowly return to the starting position. Repeat __________ times. Complete this exercise __________ times a day. Exercise H:Shoulder Abduction  1. Sit in a stable chair without armrests, or  stand. 2. Hold a __________ weight in your left / right hand, or hold an exercise band with both hands. 3. Start with your arms straight down and your left / right palm facing in, toward your body. 4. Slowly lift your left / right hand out to your side. Do not lift your hand above shoulder height unless your health care provider tells you that this is safe. ? Keep your arms straight. ? Avoid shrugging your shoulder while you do this movement. Keep your shoulder blade tucked down toward the middle of your back. 5. Hold for __________ seconds. 6. Slowly lower your arm, and return to the starting position. Repeat __________ times. Complete this exercise __________ times a day. Exercise I:Shoulder Extension 1. Sit in a stable chair without armrests, or stand. 2. Secure an exercise band to a stable object in front of you where it is at shoulder height. 3. Hold one end of the exercise band in each hand. Your palms should face each other. 4. Straighten your elbows and lift your hands up to shoulder height. 5. Step back, away from the secured end of the exercise band, until the band is tight and there is no slack. 6. Squeeze your shoulder blades together as you pull your hands down to the sides of your thighs. Stop when your hands are straight down by your sides. Do not let your hands go behind your body. 7. Hold for __________ seconds. 8. Slowly return to the starting position. Repeat __________ times. Complete this exercise __________ times a day. Exercise J:Standing Shoulder Row 1. Sit in a stable chair without armrests, or stand. 2. Secure an exercise band to a stable object in front of you so it is at waist height. 3. Hold one end of the exercise band in each hand. Your palms should be in a thumbs-up position. 4. Bend each of your elbows to an "L" shape (about 90 degrees) and keep your upper arms at your sides. 5. Step back until the band is tight and there is no slack. 6. Slowly pull your  elbows back behind you. 7. Hold for __________ seconds. 8. Slowly return to the starting  position. Repeat __________ times. Complete this exercise __________ times a day. Exercise K:Shoulder Press-Ups  1. Sit in a stable chair that has armrests. Sit upright, with your feet flat on the floor. 2. Put your hands on the armrests so your elbows are bent and your fingers are pointing forward. Your hands should be about even with the sides of your body. 3. Push down on the armrests and use your arms to lift yourself off of the chair. Straighten your elbows and lift yourself up as much as you comfortably can. ? Move your shoulder blades down, and avoid letting your shoulders move up toward your ears. ? Keep your feet on the ground. As you get stronger, your feet should support less of your body weight as you lift yourself up. 4. Hold for __________ seconds. 5. Slowly lower yourself back into the chair. Repeat __________ times. Complete this exercise __________ times a day. Exercise L: Wall Push-Ups  1. Stand so you are facing a stable wall. Your feet should be about one arm-length away from the wall. 2. Lean forward and place your palms on the wall at shoulder height. 3. Keep your feet flat on the floor as you bend your elbows and lean forward toward the wall. 4. Hold for __________ seconds. 5. Straighten your elbows to push yourself back to the starting position. Repeat __________ times. Complete this exercise __________ times a day. This information is not intended to replace advice given to you by your health care provider. Make sure you discuss any questions you have with your health care provider. Document Released: 06/01/2005 Document Revised: 04/11/2016 Document Reviewed: 03/29/2015 Elsevier Interactive Patient Education  2018 [employer].

## 2019-02-26 ENCOUNTER — Other Ambulatory Visit: Payer: Self-pay

## 2019-02-26 DIAGNOSIS — Z20822 Contact with and (suspected) exposure to covid-19: Secondary | ICD-10-CM

## 2019-02-28 LAB — NOVEL CORONAVIRUS, NAA: SARS-CoV-2, NAA: DETECTED — AB

## 2019-03-01 ENCOUNTER — Telehealth: Payer: Self-pay | Admitting: Internal Medicine

## 2019-03-01 NOTE — Telephone Encounter (Signed)
Scheduled for appt to discuss this

## 2019-03-01 NOTE — Progress Notes (Signed)
Virtual Visit via Video Note  I connected with@ on 03/04/19 at  2:00 PM EDT by a video enabled telemedicine application and verified that I am speaking with the correct person using two identifiers. Location patient: home Location provider:work  office Persons participating in the virtual visit: patient, provider  WIth national recommendations  regarding COVID 19 pandemic   video visit is advised over in office visit for this patient.  Patient aware  of the limitations of evaluation and management by telemedicine and  availability of in person appointments. and agreed to proceed.   HPI: Beth Romero presents for video visit  She tested positive  For sars CoV 2  July 28  . SHe got tested when her husband was seen in ed for fever and resp sx and high BP  And tested positive .  Since  that time she has done well    Many but not all  in her household unit were postivie but minimal illness.  She had mild st and malaise on July   22 or thereabouts . She currently has no cough and denies resp sx and fever .   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    Needs noe confirmation ok to return to work after her 14 day administrative leave.    ROS: See pertinent positives and negatives per HPI. No current cp sob  Fever   Past Medical History:  Diagnosis Date  . Abnormal Pap smear of cervix    rx cryo remote  has been normal  . Atrial septal defect    large echo 2008 and 2006  declines any surgical correction for belief reasons consutlts with [facility] . pt aware of risk of irreverible Pulm ht causind devility and death is possible without correction. declines surgery and is not a candidate for other procedures.   Marland Kitchen Hx of abnormal cervical Pap smear   . Migraine   . Murmur   . Spell of transient neurologic symptoms 12/07/2012  . Syncopal episodes     Past Surgical History:  Procedure Laterality Date  . CARDIAC  CATHETERIZATION     Summer 2009 [facility] large ASD 2:1 shunt  . Hosp  11/11/11   [facility] weakness r/o TIA neg eval  . TUBAL LIGATION    . US ECHOCARDIOGRAPHY     2008 and 2006    Family History  Problem Relation Age of Onset  . Breast cancer Mother   . Heart disease Unknown   . Other Unknown        step sister "hole in the heart"  . Other Son        SCFE  . Hypertension Maternal Grandmother     Social History   Tobacco Use  . Smoking status: Never Smoker  . Smokeless tobacco: Never Used  Substance Use Topics  . Alcohol use: No  . Drug use: No     No current outpatient medications on file.  EXAM: BP Readings from Last 3 Encounters:  06/06/18 110/68  04/19/18 122/80  04/09/18 126/82    VITALS per patient if applicable: GENERAL: alert, oriented, appears well and in no acute distress HEENT: atraumatic, conjunttiva clear, no obvious abnormalities on inspection of external nose and ears NECK: normal movements of the head and neck LUNGS: on inspection no signs of respiratory distress, breathing rate appears normal, no obvious gross SOB, gasping or wheezing CV: no obvious cyanosis  PSYCH/NEURO: pleasant and cooperative, no obvious depression or anxiety, speech and thought processing grossly intact   ASSESSMENT AND PLAN:  Discussed the following assessment and plan:   ICD-10-CM   1. COVID-19 virus detected  U07.1    mild sx  high risk conditions  recovering    Has high risk conditions but doing quite well  Avoidance  Of resp irritants  Quarantine  From Date of testing  And will send note for work .  Counseled.  Letter  For return to work on August 14th  As she is already better and  Will be over 14 days out from positive testing  Expectant management and discussion of plan and treatment with opportunity to ask questions and all were answered. The patient agreed with the plan and demonstrated an understanding of the instructions.   Advised to call back or seek an in-person  evaluation if worsening  or having  further concerns .   Berniece AndreasWanda Kalynne Womac, MD

## 2019-03-01 NOTE — Telephone Encounter (Signed)
The patient called in needing a work note for testing positive for COVID-19. She tested positive on 02/26/2019 and want her to return to work on the 17th. So she just needs to have a note to say that she is out from 02/26/2019 to 03/15/2019.   Thank you

## 2019-03-04 ENCOUNTER — Encounter: Payer: Self-pay | Admitting: Internal Medicine

## 2019-03-04 ENCOUNTER — Telehealth (INDEPENDENT_AMBULATORY_CARE_PROVIDER_SITE_OTHER): Payer: Managed Care, Other (non HMO) | Admitting: Internal Medicine

## 2019-03-04 ENCOUNTER — Other Ambulatory Visit: Payer: Self-pay

## 2019-03-04 DIAGNOSIS — U071 COVID-19: Secondary | ICD-10-CM

## 2019-03-05 NOTE — Telephone Encounter (Signed)
[place] see if you can get to print in a better format to complete .

## 2019-03-05 NOTE — Telephone Encounter (Signed)
Form completed and signed to your desk.

## 2019-03-08 NOTE — Telephone Encounter (Signed)
IM  Fine with changing the date    [place] can you help get this done.

## 2019-03-12 ENCOUNTER — Telehealth: Payer: Self-pay | Admitting: *Deleted

## 2019-03-12 NOTE — Telephone Encounter (Signed)
Spoke to pt told her Dr. Regis Bill Nurse is out I am not sure where form is a letter states going back 8/14. Pt said she is not returning to work till 8/17, was suppose to stay out till 8/14. Told her I will leave message for her nurse to fax form and letter tomorrow after fixing it.  Pt verbalized understanding.

## 2019-03-12 NOTE — Telephone Encounter (Signed)
Copied from [place] [phone]. Topic: General - Other >> Mar 12, 2019  3:11 PM Rayann Heman wrote: Reason for CRM: pt called and stated that she needed a note sent over to her jo. Pt would like to make sure note was faxed. Please see mychart message from 03/07/19

## 2019-03-13 NOTE — Telephone Encounter (Signed)
Form was faxed Monday with correct date of the 17th going to redo note and send via mychart

## 2019-04-03 ENCOUNTER — Other Ambulatory Visit: Payer: Self-pay | Admitting: Emergency Medicine

## 2019-04-03 DIAGNOSIS — Z20822 Contact with and (suspected) exposure to covid-19: Secondary | ICD-10-CM

## 2019-04-04 LAB — NOVEL CORONAVIRUS, NAA: SARS-CoV-2, NAA: NOT DETECTED

## 2019-04-24 ENCOUNTER — Encounter: Payer: Self-pay | Admitting: Gynecology

## 2019-07-31 ENCOUNTER — Encounter: Payer: Self-pay | Admitting: Family Medicine

## 2019-09-06 ENCOUNTER — Other Ambulatory Visit: Payer: Self-pay

## 2019-09-09 ENCOUNTER — Other Ambulatory Visit: Payer: Self-pay

## 2019-09-09 ENCOUNTER — Ambulatory Visit (INDEPENDENT_AMBULATORY_CARE_PROVIDER_SITE_OTHER): Payer: Managed Care, Other (non HMO) | Admitting: Internal Medicine

## 2019-09-09 ENCOUNTER — Encounter: Payer: Self-pay | Admitting: Internal Medicine

## 2019-09-09 VITALS — BP 102/78 | HR 61 | Temp 97.2°F | Ht 65.0 in | Wt 161.2 lb

## 2019-09-09 DIAGNOSIS — Q211 Atrial septal defect, unspecified: Secondary | ICD-10-CM

## 2019-09-09 DIAGNOSIS — E7800 Pure hypercholesterolemia, unspecified: Secondary | ICD-10-CM | POA: Diagnosis not present

## 2019-09-09 DIAGNOSIS — Z2821 Immunization not carried out because of patient refusal: Secondary | ICD-10-CM | POA: Diagnosis not present

## 2019-09-09 DIAGNOSIS — Z8616 Personal history of COVID-19: Secondary | ICD-10-CM | POA: Diagnosis not present

## 2019-09-09 DIAGNOSIS — Z0000 Encounter for general adult medical examination without abnormal findings: Secondary | ICD-10-CM

## 2019-09-09 NOTE — Progress Notes (Signed)
This visit occurred during the SARS-CoV-2 public health emergency.  Safety protocols were in place, including screening questions prior to the visit, additional usage of staff PPE, and extensive cleaning of exam room while observing appropriate contact time as indicated for disinfecting solutions.    Chief Complaint  Patient presents with  . Annual Exam    HPI: Patient  Beth Romero  54 y.o. comes in today for Preventive Health Care visit  No bleeding   Gyne sx  denis  Cp sob syncope of dec adls  Since had covid uuses mask and has had some travel with family and feels well with this  Will decline covid vaccine Health Maintenance  Topic Date Due  . HIV Screening  05/07/1983  . MAMMOGRAM  05/06/2018  . COLONOSCOPY  05/06/2018  . INFLUENZA VACCINE  10/30/2019 (Originally 03/02/2019)  . PAP SMEAR-Modifier  04/09/2021  . TETANUS/TDAP  07/05/2021  pap and bx dr Audie Box  For bleeding in sept 2019  Health Maintenance Review LIFESTYLE:  Exercise:  Home work so not   Exercising asa much .  Tobacco/ETS: no Alcohol: no Sugar beverages: not a lot every day  Sleep: 6-8 hours  Drug use: no HH of  5  None  Work:  Working ft at  [employer]  Wednesdays   ROS:  GEN/ HEENT: No fever, significant weight changes sweats headaches vision problems hearing changes,   CV/ PULM; No chest pain shortness of breath cough, syncope,edema  change in exercise tolerance. GI /GU: No adominal pain, vomiting, change in bowel habits. No blood in the stool. No significant GU symptoms. SKIN/HEME: ,no acute skin rashes suspicious lesions or bleeding. No lymphadenopathy, nodules, masses.  NEURO/ PSYCH:  No neurologic signs such as weakness numbness. No depression anxiety. IMM/ Allergy: No unusual infections.  Allergy .   REST of 12 system review negative except as per HPI   Past Medical History:  Diagnosis Date  . Abnormal Pap smear of cervix    rx cryo remote  has been normal  . Atrial septal defect    large echo 2008 and 2006  declines any surgical correction for belief reasons consutlts with [facility] . pt aware of risk of irreverible Pulm ht causind devility and death is possible without correction. declines surgery and is not a candidate for other procedures.   Marland Kitchen Hx of abnormal cervical Pap smear   . Migraine   . Murmur   . Spell of transient neurologic symptoms 12/07/2012  . Syncopal episodes     Past Surgical History:  Procedure Laterality Date  . CARDIAC CATHETERIZATION     Summer 2009 [facility] large ASD 2:1 shunt  . Hosp  11/11/11   [facility] weakness r/o TIA neg eval  . TUBAL LIGATION    . US ECHOCARDIOGRAPHY     2008 and 2006    Family History  Problem Relation Age of Onset  . Breast cancer Mother   . Heart disease Unknown   . Other Unknown        step sister "hole in the heart"  . Other Son        SCFE  . Hypertension Maternal Grandmother     Social History   Socioeconomic History  . Marital status: Married    Spouse name: Not on file  . Number of children: Not on file  . Years of education: Not on file  . Highest education level: Not on file  Occupational History  . Not on file  Tobacco Use  .  Smoking status: Never Smoker  . Smokeless tobacco: Never Used  Substance and Sexual Activity  . Alcohol use: No  . Drug use: No  . Sexual activity: Not Currently    Comment: 1st intercourse- 18, partners- 4, divorced  Other Topics Concern  . 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 Difficulty of Paying Living Expenses: Not on file  Food Insecurity:   . Worried About Brewing technologist in the Last Year: Not on file  . Ran Out of Food in the Last Year: Not on file  Transportation Needs:   . Lack of Transportation (Medical): Not on file  . Lack of Transportation (Non-Medical): Not on file  Physical Activity:   . Days of Exercise per Week: Not on file  . Minutes of Exercise per Session: Not on file  Stress:   . Feeling of Stress : Not on file  Social Connections:   . Frequency of Communication with Friends and Family: Not on file  . Frequency of Social Gatherings with Friends and Family: Not on file  . Attends Religious Services: Not on file  . Active Member of Clubs or Organizations: Not on file  . Attends Banker Meetings: Not on file  . Marital Status: Not on file    No outpatient medications prior to visit.   No facility-administered medications prior to visit.     EXAM:  BP 102/78 (BP Location: Right Arm, Patient Position: Sitting, Cuff Size: Normal)   Pulse 61   Temp (!) 97.2 F (36.2 C) (Temporal)   Ht 5\' 5"  (1.651 m)   Wt 161 lb 3.2 oz (73.1 kg)   LMP 08/01/2012   SpO2 98%   BMI 26.83 kg/m   Body mass index is 26.83 kg/m. Wt Readings from Last 3 Encounters:  09/09/19 161 lb 3.2 oz (73.1 kg)  06/06/18 150 lb 12.8 oz (68.4 kg)  04/09/18 155 lb (70.3 kg)    Physical Exam: Vital signs reviewed 06/09/18 is a well-developed well-nourished alert cooperative    who appearsr stated age in no acute distress.  HEENT: normocephalic atraumatic , Eyes: PERRL EOM's full, conjunctiva clear, , Ears: no deformity EAC's clear TMs with normal landmarks. Mouth:masked  NECK: supple without masses, thyromegaly or bruits. CHEST/PULM:  Clear to auscultation and percussion breath sounds equal no wheeze , rales or rhonchi. No chest wall deformities or tenderness. Breast: normal by inspection . No dimpling, discharge, masses, tenderness or discharge . CV: PMI is nondisplaced, intermittent  Premature beat  S1 S2 split  systolic murmur with out rub or  thrill  Left sided chest   Peripheral pulses are present without delay.No JVD .  ABDOMEN: Bowel sounds normal nontender  No guard or rebound, no hepato splenomegal no CVA tenderness.  Extremtities:  No clubbing cyanosis or edema, no acute joint swelling or redness no focal atrophy NEURO:  Oriented x3, cranial nerves 3-12 appear to be intact, no obvious focal weakness,gait within normal limits no abnormal reflexes or asymmetrical SKIN: No acute rashes normal turgor,  color, no bruising or petechiae. PSYCH: Oriented, good eye contact, no obvious depression anxiety, cognition and judgment appear normal. LN: no cervical axillary inguinal adenopathy BP Readings from Last 3 Encounters:  09/09/19 102/78  06/06/18 110/68  04/19/18 122/80    [facility]   Scrip order written for cmp cbcdiff lipid and tsh   ASSESSMENT AND PLAN:  Discussed the following assessment and plan:    ICD-10-CM   1. Visit for preventive health examination  Z00.00   2. Atrial septal defect  Q21.1    large declines surgery repair  last echo 2018 some elevated  pulm pressures  decies any cv pulm sx   3. Elevated LDL cholesterol level  E78.00   4. Influenza vaccination declined by patient  Z28.21   5. Personal history of covid-19  Z86.16    pt feels may have had it twice  and feels no residual declines vaccine  last echo 3 2018  Suggest echo next year   Or if sx    No sx of pulm ht but had elevated pressures last check she still declines  intervention   And strongly  believes  Not to surgically intervene .  Colon  mammo  Disc at this time  Declines immunizations    reviewed record ? Pap next year ?    Patient Care Team: Madelin Headings, MD as PCP - General Patient Instructions  Titus Dubin  You are doing well.   Lab order.  Consider  Getting echocardiogram  Next year  Get your mammogram when you can .  Consider colon cancer screening     Health Maintenance, Female Adopting a healthy lifestyle and getting  preventive care are important in promoting health and wellness. Ask your health care provider about:  The right schedule for you to have regular tests and exams.  Things you can do on your own to prevent diseases and keep yourself healthy. What should I know about diet, weight, and exercise? Eat a healthy diet   Eat a diet that includes plenty of vegetables, fruits, low-fat dairy products, and lean protein.  Do not eat a lot of foods that are high in solid fats, added sugars, or sodium. Maintain a healthy weight Body mass index (BMI) is used to identify weight problems. It estimates body fat based on height and weight. Your health care provider can help determine your BMI and help you achieve or maintain a healthy weight. Get regular exercise Get regular exercise. This is one of the most important things you can do for your health. Most adults should:  Exercise for at least 150 minutes each week. The exercise should increase your heart rate and make you sweat (moderate-intensity exercise).  Do strengthening exercises at least twice a week. This is in addition to the moderate-intensity exercise.  Spend less time sitting. Even light physical activity can be beneficial. Watch cholesterol and blood lipids Have your blood tested for lipids and cholesterol at 52 years of age, then have this test every 5 years. Have your cholesterol levels checked more often if:  Your lipid or cholesterol levels are high.  You are older than 52 years of age.  You are at high risk for heart disease. What should I know about cancer screening? Depending on your health history and family history, you may need to have cancer screening at various ages. This may include screening for:  Breast cancer.  Cervical cancer.  Colorectal cancer.  Skin cancer.  Lung cancer. What should  I know about heart disease, diabetes, and high blood pressure? Blood pressure and heart disease  High blood pressure causes  heart disease and increases the risk of stroke. This is more likely to develop in people who have high blood pressure readings, are of African descent, or are overweight.  Have your blood pressure checked: ? Every 3-5 years if you are 14-25 years of age. ? Every year if you are 4 years old or older. Diabetes Have regular diabetes screenings. This checks your fasting blood sugar level. Have the screening done:  Once every three years after age 70 if you are at a normal weight and have a low risk for diabetes.  More often and at a younger age if you are overweight or have a high risk for diabetes. What should I know about preventing infection? Hepatitis B If you have a higher risk for hepatitis B, you should be screened for this virus. Talk with your health care provider to find out if you are at risk for hepatitis B infection. Hepatitis C Testing is recommended for:  Everyone born from [birth year] through 1965.  Anyone with known risk factors for hepatitis C. Sexually transmitted infections (STIs)  Get screened for STIs, including gonorrhea and chlamydia, if: ? You are sexually active and are younger than 52 years of age. ? You are older than 52 years of age and your health care provider tells you that you are at risk for this type of infection. ? Your sexual activity has changed since you were last screened, and you are at increased risk for chlamydia or gonorrhea. Ask your health care provider if you are at risk.  Ask your health care provider about whether you are at high risk for HIV. Your health care provider may recommend a prescription medicine to help prevent HIV infection. If you choose to take medicine to prevent HIV, you should first get tested for HIV. You should then be tested every 3 months for as long as you are taking the medicine. Pregnancy  If you are about to stop having your period (premenopausal) and you may become pregnant, seek counseling before you get pregnant.  Take  400 to 800 micrograms (mcg) of folic acid every day if you become pregnant.  Ask for birth control (contraception) if you want to prevent pregnancy. Osteoporosis and menopause Osteoporosis is a disease in which the bones lose minerals and strength with aging. This can result in bone fractures. If you are 72 years old or older, or if you are at risk for osteoporosis and fractures, ask your health care provider if you should:  Be screened for bone loss.  Take a calcium or vitamin D supplement to lower your risk of fractures.  Be given hormone replacement therapy (HRT) to treat symptoms of menopause. Follow these instructions at home: Lifestyle  Do not use any products that contain nicotine or tobacco, such as cigarettes, e-cigarettes, and chewing tobacco. If you need help quitting, ask your health care provider.  Do not use street drugs.  Do not share needles.  Ask your health care provider for help if you need support or information about quitting drugs. Alcohol use  Do not drink alcohol if: ? Your health care provider tells you not to drink. ? You are pregnant, may be pregnant, or are planning to become pregnant.  If you drink alcohol: ? Limit how much you use to 0-1 drink a day. ? Limit intake if you are breastfeeding.  Be aware of how  much alcohol is in your drink. In the [place] one drink equals one 12 oz bottle of beer (355 mL), one 5 oz glass of wine (148 mL), or one 1 oz glass of hard liquor (44 mL). General instructions  Schedule regular health, dental, and eye exams.  Stay current with your vaccines.  Tell your health care provider if: ? You often feel depressed. ? You have ever been abused or do not feel safe at home. Summary  Adopting a healthy lifestyle and getting preventive care are important in promoting health and wellness.  Follow your health care provider's instructions about healthy diet, exercising, and getting tested or screened for diseases.  Follow  your health care provider's instructions on monitoring your cholesterol and blood pressure. This information is not intended to replace advice given to you by your health care provider. Make sure you discuss any questions you have with your health care provider. Document Revised: 07/11/2018 Document Reviewed: 07/11/2018 Elsevier Patient Education  2020 [employer].    Arcade K. Leiloni Smithers M.D.

## 2019-09-09 NOTE — Patient Instructions (Signed)
Glad  You are doing well.   Lab order.  Consider  Getting echocardiogram  Next year  Get your mammogram when you can .  Consider colon cancer screening     Health Maintenance, Female Adopting a healthy lifestyle and getting preventive care are important in promoting health and wellness. Ask your health care provider about:  The right schedule for you to have regular tests and exams.  Things you can do on your own to prevent diseases and keep yourself healthy. What should I know about diet, weight, and exercise? Eat a healthy diet   Eat a diet that includes plenty of vegetables, fruits, low-fat dairy products, and lean protein.  Do not eat a lot of foods that are high in solid fats, added sugars, or sodium. Maintain a healthy weight Body mass index (BMI) is used to identify weight problems. It estimates body fat based on height and weight. Your health care provider can help determine your BMI and help you achieve or maintain a healthy weight. Get regular exercise Get regular exercise. This is one of the most important things you can do for your health. Most adults should:  Exercise for at least 150 minutes each week. The exercise should increase your heart rate and make you sweat (moderate-intensity exercise).  Do strengthening exercises at least twice a week. This is in addition to the moderate-intensity exercise.  Spend less time sitting. Even light physical activity can be beneficial. Watch cholesterol and blood lipids Have your blood tested for lipids and cholesterol at 52 years of age, then have this test every 5 years. Have your cholesterol levels checked more often if:  Your lipid or cholesterol levels are high.  You are older than 52 years of age.  You are at high risk for heart disease. What should I know about cancer screening? Depending on your health history and family history, you may need to have cancer screening at various ages. This may include screening  for:  Breast cancer.  Cervical cancer.  Colorectal cancer.  Skin cancer.  Lung cancer. What should I know about heart disease, diabetes, and high blood pressure? Blood pressure and heart disease  High blood pressure causes heart disease and increases the risk of stroke. This is more likely to develop in people who have high blood pressure readings, are of African descent, or are overweight.  Have your blood pressure checked: ? Every 3-5 years if you are 52-52 years of age. ? Every year if you are 48 years old or older. Diabetes Have regular diabetes screenings. This checks your fasting blood sugar level. Have the screening done:  Once every three years after age 52 if you are at a normal weight and have a low risk for diabetes.  More often and at a younger age if you are overweight or have a high risk for diabetes. What should I know about preventing infection? Hepatitis B If you have a higher risk for hepatitis B, you should be screened for this virus. Talk with your health care provider to find out if you are at risk for hepatitis B infection. Hepatitis C Testing is recommended for:  Everyone born from [birth year] through 1963.  Anyone with known risk factors for hepatitis C. Sexually transmitted infections (STIs)  Get screened for STIs, including gonorrhea and chlamydia, if: ? You are sexually active and are younger than 52 years of age. ? You are older than 52 years of age and your health care provider tells you that you  are at risk for this type of infection. ? Your sexual activity has changed since you were last screened, and you are at increased risk for chlamydia or gonorrhea. Ask your health care provider if you are at risk.  Ask your health care provider about whether you are at high risk for HIV. Your health care provider may recommend a prescription medicine to help prevent HIV infection. If you choose to take medicine to prevent HIV, you should first get tested for HIV.  You should then be tested every 3 months for as long as you are taking the medicine. Pregnancy  If you are about to stop having your period (premenopausal) and you may become pregnant, seek counseling before you get pregnant.  Take 400 to 800 micrograms (mcg) of folic acid every day if you become pregnant.  Ask for birth control (contraception) if you want to prevent pregnancy. Osteoporosis and menopause Osteoporosis is a disease in which the bones lose minerals and strength with aging. This can result in bone fractures. If you are 34 years old or older, or if you are at risk for osteoporosis and fractures, ask your health care provider if you should:  Be screened for bone loss.  Take a calcium or vitamin D supplement to lower your risk of fractures.  Be given hormone replacement therapy (HRT) to treat symptoms of menopause. Follow these instructions at home: Lifestyle  Do not use any products that contain nicotine or tobacco, such as cigarettes, e-cigarettes, and chewing tobacco. If you need help quitting, ask your health care provider.  Do not use street drugs.  Do not share needles.  Ask your health care provider for help if you need support or information about quitting drugs. Alcohol use  Do not drink alcohol if: ? Your health care provider tells you not to drink. ? You are pregnant, may be pregnant, or are planning to become pregnant.  If you drink alcohol: ? Limit how much you use to 0-1 drink a day. ? Limit intake if you are breastfeeding.  Be aware of how much alcohol is in your drink. In the [place] one drink equals one 12 oz bottle of beer (355 mL), one 5 oz glass of wine (148 mL), or one 1 oz glass of hard liquor (44 mL). General instructions  Schedule regular health, dental, and eye exams.  Stay current with your vaccines.  Tell your health care provider if: ? You often feel depressed. ? You have ever been abused or do not feel safe at  home. Summary  Adopting a healthy lifestyle and getting preventive care are important in promoting health and wellness.  Follow your health care provider's instructions about healthy diet, exercising, and getting tested or screened for diseases.  Follow your health care provider's instructions on monitoring your cholesterol and blood pressure. This information is not intended to replace advice given to you by your health care provider. Make sure you discuss any questions you have with your health care provider. Document Revised: 07/11/2018 Document Reviewed: 07/11/2018 Elsevier Patient Education  2020 [employer].

## 2020-06-17 ENCOUNTER — Telehealth (INDEPENDENT_AMBULATORY_CARE_PROVIDER_SITE_OTHER): Payer: Managed Care, Other (non HMO) | Admitting: Adult Health

## 2020-06-17 ENCOUNTER — Encounter: Payer: Self-pay | Admitting: Adult Health

## 2020-06-17 VITALS — Ht 65.0 in | Wt 165.0 lb

## 2020-06-17 DIAGNOSIS — J029 Acute pharyngitis, unspecified: Secondary | ICD-10-CM | POA: Diagnosis not present

## 2020-06-17 MED ORDER — FLUTICASONE PROPIONATE 50 MCG/ACT NA SUSP: 2.0000 | Freq: Every day | NASAL | 6 refills | Status: DC

## 2020-06-17 NOTE — Progress Notes (Signed)
Virtual Visit via Video Note  I connected with Elyse Hsu on 06/17/20 at 11:00 AM EST by a video enabled telemedicine application and verified that I am speaking with the correct person using two identifiers.  Location patient: home Location provider:work or home office Persons participating in the virtual visit: patient, provider  I discussed the limitations of evaluation and management by telemedicine and the availability of in person appointments. The patient expressed understanding and agreed to proceed.   HPI: 54 year old female who is being evaluated today for sore throat x2 days.  She reports that her throat is only sore in the morning, but when she coughs up mucus a sore throat goes away she does not experience a sore throat until the next morning.  She denies fevers, chills, sinus pain or pressure, or runny nose.  She has not experienced any shortness of breath or wheezing.   ROS: See pertinent positives and negatives per HPI.  Past Medical History:  Diagnosis Date   Abnormal Pap smear of cervix    rx cryo remote  has been normal   Atrial septal defect    large echo 2008 and 2006  declines any surgical correction for belief reasons consutlts with [facility] . pt aware of risk of irreverible Pulm ht causind devility and death is possible without correction. declines surgery and is not a candidate for other procedures.    Hx of abnormal cervical Pap smear    Migraine    Murmur    Spell of transient neurologic symptoms 12/07/2012   Syncopal episodes     Past Surgical History:  Procedure Laterality Date   CARDIAC CATHETERIZATION     Summer 2009 [facility] large ASD 2:1 shunt   [facility]  11/11/11   [facility] weakness r/o TIA neg eval   TUBAL LIGATION     US ECHOCARDIOGRAPHY     2008 and 2006    Family History  Problem Relation Age of Onset   Breast cancer Mother    Heart disease Unknown    Other Unknown        step sister "hole in the heart"   Other Son         SCFE   Hypertension Maternal Grandmother       No current outpatient medications on file.  EXAM:  VITALS per patient if applicable:  GENERAL: alert, oriented, appears well and in no acute distress  HEENT: atraumatic, conjunttiva clear, no obvious abnormalities on inspection of external nose and ears  NECK: normal movements of the head and neck  LUNGS: on inspection no signs of respiratory distress, breathing rate appears normal, no obvious gross SOB, gasping or wheezing  CV: no obvious cyanosis  MS: moves all visible extremities without noticeable abnormality  PSYCH/NEURO: pleasant and cooperative, no obvious depression or anxiety, speech and thought processing grossly intact  ASSESSMENT AND PLAN:  Discussed the following assessment and plan:  1. Sore throat -Possibly from postnasal drip.  Does not appear to be bacterial.  We will have her start Flonase at night and see if this helps with the postnasal drip.  Follow-up instructions given - fluticasone (FLONASE) 50 MCG/ACT nasal spray; Place 2 sprays into both nostrils daily.  Dispense: 16 g; Refill: 6       I discussed the assessment and treatment plan with the patient. The patient was provided an opportunity to ask questions and all were answered. The patient agreed with the plan and demonstrated an understanding of the instructions.   The  patient was advised to call back or seek an in-person evaluation if the symptoms worsen or if the condition fails to improve as anticipated.   Shirline Frees, NP

## 2020-06-30 ENCOUNTER — Encounter: Payer: Self-pay | Admitting: Internal Medicine

## 2020-08-11 ENCOUNTER — Encounter: Payer: Self-pay | Admitting: Internal Medicine

## 2020-09-11 ENCOUNTER — Encounter: Payer: Managed Care, Other (non HMO) | Admitting: Internal Medicine

## 2020-09-11 ENCOUNTER — Telehealth: Payer: Self-pay | Admitting: Internal Medicine

## 2020-09-11 NOTE — Telephone Encounter (Signed)
The patient called wanting to know if Dr. Fabian Sharp had left a prescription up front for her to do her physical labs some where else.  Please advise

## 2020-09-14 NOTE — Progress Notes (Signed)
Chief Complaint  Patient presents with  . Annual Exam    HPI: Patient  Beth Romero  55 y.o. comes in today for Preventive Health Care visit  Needs prescription to get lab done at her work. No major change in health since her last visit. Has traveled.  Feels no residual from when she had COVID and has had her Materna vaccine in the fall. Is menopausal due for Pap smear no bleeding. 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 Check small bump that has been there a couple years on the right labia that gets irritated no bleeding no itching. Health Maintenance  Topic Date Due  . Hepatitis C Screening  Never done  . HIV Screening  Never done  . COLONOSCOPY (Pts 45-16yr Insurance coverage will need to be confirmed)  Never done  . MAMMOGRAM  Never done  . INFLUENZA VACCINE  10/29/2021 (Originally 03/01/2020)  . COVID-19 Vaccine (3 - Booster for Moderna series) 12/28/2020  . PAP SMEAR-Modifier  04/09/2021  . TETANUS/TDAP  07/05/2021   Health Maintenance Review LIFESTYLE:  Exercise:  Sad   less now .  Tobacco/ETS:n Alcohol: n Sugar beverages:  y water  Soda  Sleep:  6-7  Drug use: no HH of 3  No pets  Work: home   7-4   Declines flu vaccine declining colonoscopy but willing to do Cologuard no family history Due for mammogram we will get this scheduled. ROS:  GEN/ HEENT: No fever, significant weight changes sweats headaches vision problems hearing changes, CV/ PULM; No chest pain shortness of breath cough, syncope,edema  change in exercise tolerance. GI /GU: No adominal pain, vomiting, change in bowel habits. No blood in the stool. No significant GU symptoms. SKIN/HEME: ,no acute skin rashes suspicious lesions or bleeding. No lymphadenopathy, nodules, masses.  NEURO/ PSYCH:  No neurologic signs such as weakness numbness.  No depression anxiety. IMM/ Allergy: No unusual infections.  Allergy .   REST of 12 system review negative except as per HPI   Past Medical History:  Diagnosis Date  . Abnormal Pap smear of cervix    rx cryo remote  has been normal  . Atrial septal defect    large echo 2008 and 2006  declines any surgical correction for belief reasons consutlts with [facility] . pt aware of risk of irreverible Pulm ht causind devility and death is possible without correction. declines surgery and is not a candidate for other procedures.   .Marland KitchenHx of abnormal cervical Pap smear   . Migraine   . Murmur   . Spell of transient neurologic symptoms 12/07/2012  . Syncopal episodes     Past Surgical History:  Procedure Laterality Date  . CARDIAC CATHETERIZATION     Summer 2009 [facility] large ASD 2:1 shunt  . Hosp  11/11/11   [facility] weakness r/o TIA neg eval  . TUBAL LIGATION    . [place]     2008 and 2006    Family History  Problem Relation Age of Onset  . Breast cancer Mother   . Heart disease Unknown   . Other Unknown        step sister "hole in the heart"  . Other Son        SCFE  . Hypertension Maternal Grandmother     Social History  Socioeconomic History  . Marital status: Married    Spouse name: Not on file  . Number of children: Not on file  . Years of education: Not on file  . Highest education level: Not on file  Occupational History  . Not on file  Tobacco Use  . Smoking status: Never Smoker  . Smokeless tobacco: Never Used  Vaping Use  . Vaping Use: Never used  Substance and Sexual Activity  . Alcohol use: No  . Drug use: No  . Sexual activity: Not Currently    Comment: 1st intercourse- 18, partners- 4, divorced  Other Topics Concern  . Not on file  Social History Narrative   Occupation: works at [employer] 40 hours week [place] [employer] Married now working from home during covid    Husband currently unemployed    Regular exercise- no   HH of 4   No pets   Church on [place] doesn't celebrate holidays   Doesn't do animal products and " natural"    Social Determinants of Radio broadcast assistant Strain: Not on [employer] Insecurity: Not on file  Transportation Needs: Not on file  Physical Activity: Not on file  Stress: Not on file  Social Connections: Not on file    Outpatient Medications Prior to Visit  Medication Sig Dispense Refill  . fluticasone (FLONASE) 50 MCG/ACT nasal spray Place 2 sprays into both nostrils daily. 16 g 6   No facility-administered medications prior to visit.     EXAM:  BP 102/76 (BP Location: Right Arm, Patient Position: Sitting, Cuff Size: Normal)   Pulse 92   Temp 97.9 F (36.6 C) (Oral)   Ht '5\' 4"'  (1.626 m)   Wt 167 lb (75.8 kg)   LMP 08/01/2012   SpO2 100%   BMI 28.67 kg/m   Body mass index is 28.67 kg/m. Wt Readings from Last 3 Encounters:  09/15/20 167 lb (75.8 kg)  06/17/20 165 lb (74.8 kg)  09/09/19 161 lb 3.2 oz (73.1 kg)    Physical Exam: Vital signs reviewed YJE:HUDJ is a well-developed well-nourished alert cooperative    who appearsr stated age in no acute distress.  HEENT: normocephalic atraumatic , Eyes: PERRL EOM's full, conjunctiva clear, Nares: paten,t no deformity discharge or tenderness., Ears: no deformity EAC's clear TMs with normal landmarks. Mouth: Masked NECK: supple without masses, thyromegaly or bruits. CHEST/PULM:  Clear to auscultation and percussion breath sounds equal no wheeze , rales or rhonchi. No chest wall deformities or tenderness. Breast: normal by inspection . No dimpling, discharge, masses, tenderness or discharge . CV: PMI is nondisplaced, S1 S2 n Long systolic murmur mid and lateral chest unable to tell as to split peripheral pulses are full without delay.No JVD .  ABDOMEN: Bowel sounds normal nontender  No guard or rebound, no hepato splenomegal no CVA tenderness.  . Extremtities:  No clubbing cyanosis or  edema, no acute joint swelling or redness no focal atrophy NEURO:  Oriented x3, cranial nerves 3-12 appear to be intact, no obvious focal weakness,gait within normal limits no abnormal reflexes or asymmetrical SKIN: No acute rashes normal turgor, color, no bruising or petechiae. PSYCH: Oriented, good eye contact, no obvious depression anxiety, cognition and judgment appear normal. LN: no cervical axillary inguinal adenopathy Pelvic: NL ext GU, labia clear no or rash  2 mm ?skintag vs other  lesion right labia. Vagina no lesions .Cervix: clear  UTERUS: Neg CMT Adnexa:  clear no masses . PAP done   Lab Results  Component Value Date   WBC 6.8 05/17/2017   HGB 13.9 05/17/2017   HCT 42.5 05/17/2017   PLT 205 05/17/2017   GLUCOSE 79 05/17/2017   CHOL 215 (H) 05/17/2017   TRIG 80 05/17/2017   HDL 50 05/17/2017   LDLCALC 149 (H) 05/17/2017   ALT 14 05/17/2017   AST 19 05/17/2017   NA 138 05/17/2017   K 4.2 05/17/2017   CL 100 05/17/2017   CREATININE 0.90 05/17/2017   BUN 14 05/17/2017   CO2 23 05/17/2017   TSH 1.590 05/17/2017   INR 1.1 11/11/2008   HGBA1C  11/11/2008    5.7 (NOTE) The ADA recommends the following therapeutic goal for glycemic control related to Hgb A1c measurement: Goal of therapy: <6.5 Hgb A1c  Reference: American Diabetes Association: Clinical Practice Recommendations 2010, Diabetes Care, 2010, 33: (Suppl  1).    BP Readings from Last 3 Encounters:  09/15/20 102/76  09/09/19 102/78  06/06/18 110/68    Lab plan and prescription reviewed with patient   ASSESSMENT AND PLAN:  Discussed the following assessment and plan:    ICD-10-CM   1. Visit for preventive health examination  Z00.00   2. Elevated LDL cholesterol level  E78.00   3. ATRIAL SEPTAL DEFECT  Q21.1   4. Encounter for preventative adult health care exam with abnormal findings  Z00.01   5. Pap smear for cervical cancer screening  Z12.4 PAP [Prichard]  6. Screening for colon cancer  Z12.11  Cologuard  7. Encounter for gynecological examination without abnormal finding  Z01.419   She will be getting her labs done at her work situation Healthy lifestyle agrees to [employer] but not flu vaccine She is willing to do reassessment cardiology wise at my request for my help is still opposed to any major surgery.  Appreciate any input about other advice short of major surgery. Her last echo was in 2018 showed right ventricular dilatation and some increase in pulmonary pressure.  Currently she denies any new symptoms. Can monitor bump right labia seems benign reevaluate if progressive. Return in about 1 year (around 09/15/2021) for depending on results.  Patient Care Team: Burnis Medin, MD as PCP - General Patient Instructions   Glad you are doing well. Order for labs. We will order Cologuard for colon cancer screening kit to be sent to your house. We will review record and consider getting another cardiology consult advice about any interventions that are acceptable to you.    Health Maintenance, Female Adopting a healthy lifestyle and getting preventive care are important in promoting health and wellness. Ask your health care provider about:  The right schedule for you to have regular tests and exams.  Things you can do on your own to prevent diseases and keep yourself healthy. What should I know about diet, weight, and exercise? Eat a healthy diet  Eat a diet that includes plenty of vegetables, fruits, low-fat dairy products, and lean protein.  Do not eat a lot of foods that are high in solid fats, added sugars, or sodium.   Maintain a healthy weight Body mass index (BMI) is used to identify weight problems. It estimates body fat based on height and weight. Your health care provider can help determine your BMI and help you achieve or maintain a healthy weight. Get regular exercise Get regular exercise. This is one of the most important things you can do for your health.  Most adults should:  Exercise for at least 150 minutes each week. The exercise should increase your heart rate and make you sweat (moderate-intensity exercise).  Do strengthening exercises at least twice a week. This is in addition to the moderate-intensity exercise.  Spend less time sitting. Even light physical activity can be beneficial. Watch cholesterol and blood lipids Have your blood tested for lipids and cholesterol at 53 years of age, then have this test every 5 years. Have your cholesterol levels checked more often if:  Your lipid or cholesterol levels are high.  You are older than 52 years of age.  You are at high risk for heart disease. What should I know about cancer screening? Depending on your health history and family history, you may need to have cancer screening at various ages. This may include screening for:  Breast cancer.  Cervical cancer.  Colorectal cancer.  Skin cancer.  Lung cancer. What should I know about heart disease, diabetes, and high blood pressure? Blood pressure and heart disease  High blood pressure causes heart disease and increases the risk of stroke. This is more likely to develop in people who have high blood pressure readings, are of African descent, or are overweight.  Have your blood pressure checked: ? Every 3-5 years if you are 55-32 years of age. ? Every year if you are 7 years old or older. Diabetes Have regular diabetes screenings. This checks your fasting blood sugar level. Have the screening done:  Once every three years after age 78 if you are at a normal weight and have a low risk for diabetes.  More often and at a younger age if you are overweight or have a high risk for diabetes. What should I know about preventing infection? Hepatitis B If you have a higher risk for hepatitis B, you should be screened for this virus. Talk with your health care provider to find out if you are at risk for hepatitis B  infection. Hepatitis C Testing is recommended for:  Everyone born from [birth year] through 1963.  Anyone with known risk factors for hepatitis C. Sexually transmitted infections (STIs)  Get screened for STIs, including gonorrhea and chlamydia, if: ? You are sexually active and are younger than 53 years of age. ? You are older than 53 years of age and your health care provider tells you that you are at risk for this type of infection. ? Your sexual activity has changed since you were last screened, and you are at increased risk for chlamydia or gonorrhea. Ask your health care provider if you are at risk.  Ask your health care provider about whether you are at high risk for HIV. Your health care provider may recommend a prescription medicine to help prevent HIV infection. If you choose to take medicine to prevent HIV, you should first get tested for HIV. You should then be tested every 3 months for as long as you are taking the medicine. Pregnancy  If you are about to stop having your period (premenopausal) and you may become pregnant, seek counseling before you get pregnant.  Take 400 to 800 micrograms (mcg) of folic acid every day if you become pregnant.  Ask for birth control (contraception) if you want to prevent pregnancy. Osteoporosis and menopause Osteoporosis is a disease in which the bones lose minerals and strength with aging. This can result in bone fractures. If you are 12 years old or older, or if you are at risk for osteoporosis and fractures, ask your  health care provider if you should:  Be screened for bone loss.  Take a calcium or vitamin D supplement to lower your risk of fractures.  Be given hormone replacement therapy (HRT) to treat symptoms of menopause. Follow these instructions at home: Lifestyle  Do not use any products that contain nicotine or tobacco, such as cigarettes, e-cigarettes, and chewing tobacco. If you need help quitting, ask your health care  provider.  Do not use street drugs.  Do not share needles.  Ask your health care provider for help if you need support or information about quitting drugs. Alcohol use  Do not drink alcohol if: ? Your health care provider tells you not to drink. ? You are pregnant, may be pregnant, or are planning to become pregnant.  If you drink alcohol: ? Limit how much you use to 0-1 drink a day. ? Limit intake if you are breastfeeding.  Be aware of how much alcohol is in your drink. In the [place] one drink equals one 12 oz bottle of beer (355 mL), one 5 oz glass of wine (148 mL), or one 1 oz glass of hard liquor (44 mL). General instructions  Schedule regular health, dental, and eye exams.  Stay current with your vaccines.  Tell your health care provider if: ? You often feel depressed. ? You have ever been abused or do not feel safe at home. Summary  Adopting a healthy lifestyle and getting preventive care are important in promoting health and wellness.  Follow your health care provider's instructions about healthy diet, exercising, and getting tested or screened for diseases.  Follow your health care provider's instructions on monitoring your cholesterol and blood pressure. This information is not intended to replace advice given to you by your health care provider. Make sure you discuss any questions you have with your health care provider. Document Revised: 07/11/2018 Document Reviewed: 07/11/2018 Elsevier Patient Education  2021 Camden K. Panosh M.D.

## 2020-09-15 ENCOUNTER — Other Ambulatory Visit (HOSPITAL_COMMUNITY)
Admission: RE | Admit: 2020-09-15 | Discharge: 2020-09-15 | Disposition: A | Discharge status: Home / Self Care | Payer: Managed Care, Other (non HMO) | Source: Ambulatory Visit | Attending: Internal Medicine | Admitting: Internal Medicine

## 2020-09-15 ENCOUNTER — Other Ambulatory Visit: Payer: Self-pay

## 2020-09-15 ENCOUNTER — Ambulatory Visit (INDEPENDENT_AMBULATORY_CARE_PROVIDER_SITE_OTHER): Payer: Managed Care, Other (non HMO) | Admitting: Internal Medicine

## 2020-09-15 ENCOUNTER — Encounter: Payer: Self-pay | Admitting: Internal Medicine

## 2020-09-15 VITALS — BP 102/76 | HR 92 | Temp 97.9°F | Ht 64.0 in | Wt 167.0 lb

## 2020-09-15 DIAGNOSIS — Q211 Atrial septal defect: Secondary | ICD-10-CM

## 2020-09-15 DIAGNOSIS — Z124 Encounter for screening for malignant neoplasm of cervix: Secondary | ICD-10-CM

## 2020-09-15 DIAGNOSIS — E7800 Pure hypercholesterolemia, unspecified: Secondary | ICD-10-CM

## 2020-09-15 DIAGNOSIS — Z1211 Encounter for screening for malignant neoplasm of colon: Secondary | ICD-10-CM

## 2020-09-15 DIAGNOSIS — Q2111 Secundum atrial septal defect: Secondary | ICD-10-CM

## 2020-09-15 DIAGNOSIS — Z0000 Encounter for general adult medical examination without abnormal findings: Secondary | ICD-10-CM

## 2020-09-15 DIAGNOSIS — Z01419 Encounter for gynecological examination (general) (routine) without abnormal findings: Secondary | ICD-10-CM

## 2020-09-15 DIAGNOSIS — Z0001 Encounter for general adult medical examination with abnormal findings: Secondary | ICD-10-CM

## 2020-09-15 NOTE — Patient Instructions (Signed)
Glad you are doing well. Order for labs. We will order Cologuard for colon cancer screening kit to be sent to your house. We will review record and consider getting another cardiology consult advice about any interventions that are acceptable to you.    Health Maintenance, Female Adopting a healthy lifestyle and getting preventive care are important in promoting health and wellness. Ask your health care provider about:  The right schedule for you to have regular tests and exams.  Things you can do on your own to prevent diseases and keep yourself healthy. What should I know about diet, weight, and exercise? Eat a healthy diet  Eat a diet that includes plenty of vegetables, fruits, low-fat dairy products, and lean protein.  Do not eat a lot of foods that are high in solid fats, added sugars, or sodium.   Maintain a healthy weight Body mass index (BMI) is used to identify weight problems. It estimates body fat based on height and weight. Your health care provider can help determine your BMI and help you achieve or maintain a healthy weight. Get regular exercise Get regular exercise. This is one of the most important things you can do for your health. Most adults should:  Exercise for at least 150 minutes each week. The exercise should increase your heart rate and make you sweat (moderate-intensity exercise).  Do strengthening exercises at least twice a week. This is in addition to the moderate-intensity exercise.  Spend less time sitting. Even light physical activity can be beneficial. Watch cholesterol and blood lipids Have your blood tested for lipids and cholesterol at 53 years of age, then have this test every 5 years. Have your cholesterol levels checked more often if:  Your lipid or cholesterol levels are high.  You are older than 53 years of age.  You are at high risk for heart disease. What should I know about cancer screening? Depending on your health history and family  history, you may need to have cancer screening at various ages. This may include screening for:  Breast cancer.  Cervical cancer.  Colorectal cancer.  Skin cancer.  Lung cancer. What should I know about heart disease, diabetes, and high blood pressure? Blood pressure and heart disease  High blood pressure causes heart disease and increases the risk of stroke. This is more likely to develop in people who have high blood pressure readings, are of African descent, or are overweight.  Have your blood pressure checked: ? Every 3-5 years if you are 16-63 years of age. ? Every year if you are 36 years old or older. Diabetes Have regular diabetes screenings. This checks your fasting blood sugar level. Have the screening done:  Once every three years after age 55 if you are at a normal weight and have a low risk for diabetes.  More often and at a younger age if you are overweight or have a high risk for diabetes. What should I know about preventing infection? Hepatitis B If you have a higher risk for hepatitis B, you should be screened for this virus. Talk with your health care provider to find out if you are at risk for hepatitis B infection. Hepatitis C Testing is recommended for:  Everyone born from [birth year] through 1963.  Anyone with known risk factors for hepatitis C. Sexually transmitted infections (STIs)  Get screened for STIs, including gonorrhea and chlamydia, if: ? You are sexually active and are younger than 53 years of age. ? You are older than 53 years  of age and your health care provider tells you that you are at risk for this type of infection. ? Your sexual activity has changed since you were last screened, and you are at increased risk for chlamydia or gonorrhea. Ask your health care provider if you are at risk.  Ask your health care provider about whether you are at high risk for HIV. Your health care provider may recommend a prescription medicine to help prevent HIV  infection. If you choose to take medicine to prevent HIV, you should first get tested for HIV. You should then be tested every 3 months for as long as you are taking the medicine. Pregnancy  If you are about to stop having your period (premenopausal) and you may become pregnant, seek counseling before you get pregnant.  Take 400 to 800 micrograms (mcg) of folic acid every day if you become pregnant.  Ask for birth control (contraception) if you want to prevent pregnancy. Osteoporosis and menopause Osteoporosis is a disease in which the bones lose minerals and strength with aging. This can result in bone fractures. If you are 31 years old or older, or if you are at risk for osteoporosis and fractures, ask your health care provider if you should:  Be screened for bone loss.  Take a calcium or vitamin D supplement to lower your risk of fractures.  Be given hormone replacement therapy (HRT) to treat symptoms of menopause. Follow these instructions at home: Lifestyle  Do not use any products that contain nicotine or tobacco, such as cigarettes, e-cigarettes, and chewing tobacco. If you need help quitting, ask your health care provider.  Do not use street drugs.  Do not share needles.  Ask your health care provider for help if you need support or information about quitting drugs. Alcohol use  Do not drink alcohol if: ? Your health care provider tells you not to drink. ? You are pregnant, may be pregnant, or are planning to become pregnant.  If you drink alcohol: ? Limit how much you use to 0-1 drink a day. ? Limit intake if you are breastfeeding.  Be aware of how much alcohol is in your drink. In the [place] one drink equals one 12 oz bottle of beer (355 mL), one 5 oz glass of wine (148 mL), or one 1 oz glass of hard liquor (44 mL). General instructions  Schedule regular health, dental, and eye exams.  Stay current with your vaccines.  Tell your health care provider if: ? You  often feel depressed. ? You have ever been abused or do not feel safe at home. Summary  Adopting a healthy lifestyle and getting preventive care are important in promoting health and wellness.  Follow your health care provider's instructions about healthy diet, exercising, and getting tested or screened for diseases.  Follow your health care provider's instructions on monitoring your cholesterol and blood pressure. This information is not intended to replace advice given to you by your health care provider. Make sure you discuss any questions you have with your health care provider. Document Revised: 07/11/2018 Document Reviewed: 07/11/2018 Elsevier Patient Education  2021 [employer].

## 2020-09-16 NOTE — Telephone Encounter (Signed)
Message received too late to get it done I had a time prescription given to patient at visit

## 2020-09-17 ENCOUNTER — Other Ambulatory Visit: Payer: Self-pay | Admitting: Internal Medicine

## 2020-09-17 NOTE — Progress Notes (Signed)
Tell patient PAP is normal. HPV high  risk is negative can repeat in 3-5 years

## 2020-09-18 LAB — CBC WITH DIFFERENTIAL/PLATELET
Basophils Absolute: 0.1 10*3/uL (ref 0.0–0.2)
Basos: 1 %
EOS (ABSOLUTE): 0.1 10*3/uL (ref 0.0–0.4)
Eos: 1 %
Hematocrit: 43.5 % (ref 34.0–46.6)
Hemoglobin: 14.4 g/dL (ref 11.1–15.9)
Immature Grans (Abs): 0 10*3/uL (ref 0.0–0.1)
Immature Granulocytes: 0 %
Lymphocytes Absolute: 1.7 10*3/uL (ref 0.7–3.1)
Lymphs: 21 %
MCH: 29 pg (ref 26.6–33.0)
MCHC: 33.1 g/dL (ref 31.5–35.7)
MCV: 88 fL (ref 79–97)
Monocytes Absolute: 0.5 10*3/uL (ref 0.1–0.9)
Monocytes: 6 %
Neutrophils Absolute: 5.9 10*3/uL (ref 1.4–7.0)
Neutrophils: 71 %
Platelets: 204 10*3/uL (ref 150–450)
RBC: 4.96 x10E6/uL (ref 3.77–5.28)
RDW: 14 % (ref 11.7–15.4)
WBC: 8.3 10*3/uL (ref 3.4–10.8)

## 2020-09-18 LAB — COMPREHENSIVE METABOLIC PANEL
ALT: 17 IU/L (ref 0–32)
AST: 25 IU/L (ref 0–40)
Albumin/Globulin Ratio: 1.1 — ABNORMAL LOW (ref 1.2–2.2)
Albumin: 4.4 g/dL (ref 3.8–4.9)
Alkaline Phosphatase: 86 IU/L (ref 44–121)
BUN/Creatinine Ratio: 16 (ref 9–23)
BUN: 16 mg/dL (ref 6–24)
Bilirubin Total: 0.5 mg/dL (ref 0.0–1.2)
CO2: 23 mmol/L (ref 20–29)
Calcium: 9.5 mg/dL (ref 8.7–10.2)
Chloride: 103 mmol/L (ref 96–106)
Creatinine, Ser: 0.97 mg/dL (ref 0.57–1.00)
GFR calc Af Amer: 78 mL/min/{1.73_m2} (ref >59)
GFR calc non Af Amer: 67 mL/min/{1.73_m2} (ref >59)
Globulin, Total: 3.9 g/dL (ref 1.5–4.5)
Glucose: 77 mg/dL (ref 65–99)
Potassium: 4.2 mmol/L (ref 3.5–5.2)
Sodium: 141 mmol/L (ref 134–144)
Total Protein: 8.3 g/dL (ref 6.0–8.5)

## 2020-09-18 LAB — LIPID PANEL W/O CHOL/HDL RATIO
Cholesterol, Total: 218 mg/dL — ABNORMAL HIGH (ref 100–199)
HDL: 53 mg/dL (ref >39)
LDL Chol Calc (NIH): 145 mg/dL — ABNORMAL HIGH (ref 0–99)
Triglycerides: 110 mg/dL (ref 0–149)
VLDL Cholesterol Cal: 20 mg/dL (ref 5–40)

## 2020-09-18 LAB — CYTOLOGY - PAP
Comment: NEGATIVE
Diagnosis: NEGATIVE
Diagnosis: REACTIVE
High risk HPV: NEGATIVE

## 2020-09-18 LAB — TSH: TSH: 1.68 u[IU]/mL (ref 0.450–4.500)

## 2020-09-19 NOTE — Progress Notes (Signed)
Updated pap results still negative

## 2020-09-24 NOTE — Progress Notes (Signed)
So blood work is stable  except cholesterol is up and needs to be better .  I did do a referral to cardiology to get an updated assessment about heart status and any updated options available  to you .

## 2020-09-28 LAB — COLOGUARD: Cologuard: NEGATIVE

## 2020-10-04 NOTE — Progress Notes (Unsigned)
Cardiology Office Note:   Date:  10/05/2020  NAME:  Beth Romero    MRN: [id number] DOB:  [birth year]   PCP:  Madelin Headings, MD  Cardiologist:  No primary care provider on file.   Referring MD: Madelin Headings, MD   Chief Complaint  Patient presents with  . Atrial Septal Defect   History of Present Illness:   Beth Romero is a 55 y.o. female with a hx of ASD, HLD who is being seen today for the evaluation of ASD at the request of Panosh, Neta Mends, MD.  She reports she was diagnosed with a very large secundum ASD several years ago.  She refused surgery then.  She reports she is still interested in cardiac surgery.  She reports no major symptoms.  Her EKG in office demonstrates sinus rhythm with PACs.  She reports she has no symptoms of chest pain or shortness of breath.  She does understand the consequences of an unrepaired ASD.  She reports that her faith is more important.  She reports she is done well since being diagnosed in inform me that she likely will continue to do well.  She reports she can ride a bike for up to 10 miles per day without any limitations.  She is a never smoker, does not drink alcohol or use drugs.  She reports no strong family history of heart disease.  Most recent lipid profile as below.  She overall is doing well without any symptoms.  She reports she is still not issue in surgery.  She is amenable to repeating echocardiogram.  If she does ultimately develop irreversible pulmonary hypertension she may be open to medications.  She does not appear to be there yet.  Problem List 1. ASD 2. T chol 218, HDL 53, LDL 145, TG 110 3. TIA 4. PACs  Past Medical History: Past Medical History:  Diagnosis Date  . Abnormal Pap smear of cervix    rx cryo remote  has been normal  . Atrial septal defect    large echo 2008 and 2006  declines any surgical correction for belief reasons consutlts with [facility] . pt aware of risk of irreverible Pulm ht causind  devility and death is possible without correction. declines surgery and is not a candidate for other procedures.   Marland Kitchen Hx of abnormal cervical Pap smear   . Migraine   . Murmur   . Spell of transient neurologic symptoms 12/07/2012  . Stroke [facility]) 2010   TIA  . Syncopal episodes     Past Surgical History: Past Surgical History:  Procedure Laterality Date  . CARDIAC CATHETERIZATION     Summer 2009 [facility] large ASD 2:1 shunt  . Hosp  11/11/11   [facility] weakness r/o TIA neg eval  . TUBAL LIGATION    . US ECHOCARDIOGRAPHY     2008 and 2006    Current Medications: No outpatient medications have been marked as taking for the 10/05/20 encounter (Office Visit) with Sande Rives, MD.     Allergies:    Patient has no known allergies.   Social History: Social History   Socioeconomic History  . Marital status: Married    Spouse name: Not on file  . Number of children: 4  . Years of education: Not on file  . Highest education level: Not on file  Occupational History  . Occupation: Educational psychologist  Tobacco Use  . Smoking status: Never Smoker  . Smokeless tobacco: Never Used  Vaping Use  .  Vaping Use: Never used  Substance and Sexual Activity  . Alcohol use: No  . Drug use: No  . Sexual activity: Not Currently    Comment: 1st intercourse- 18, partners- 4, divorced  Other Topics Concern  . Not on file  Social History Narrative   Occupation: works at [employer] 40 hours week Wellsite geologisttech support  School [employer]  finance accountling degree Married now working from home during covid    Husband currently unemployed   Regular exercise- no   HH of 4   No pets   [employer] [place] doesn't celebrate holidays   Doesn't do animal products and " natural"    Social Determinants of Corporate investment bankerHealth   Financial Resource Strain: Not on Ship brokerfile  Food Insecurity: Not on file  Transportation Needs: Not on file  Physical Activity: Not on file  Stress: Not on file  Social Connections: Not on file      Family History: The patient's family history includes Breast cancer in her mother; Heart disease in an other family member; Hypertension in her maternal grandmother; Other in her son and another family member.  ROS:   All other ROS reviewed and negative. Pertinent positives noted in the HPI.     EKGs/Labs/Other Studies Reviewed:   The following studies were personally reviewed by me today:  EKG:  EKG is ordered today.  The ekg ordered today demonstrates normal sinus rhythm heart rate 83, nonspecific IVCD noted, PACs, and was personally reviewed by me.   TTE 10/19/2016 - Left ventricle: The cavity size was normal. Wall thickness was  normal. Systolic function was vigorous. The estimated ejection  fraction was in the range of 65% to 70%. Wall motion was normal;  there were no regional wall motion abnormalities. The mitral  inflow pattern is codominant, suggesting borderline diastolic  dysfunction.  - Left atrium: The atrium was normal in size.  - Right ventricle: The cavity size was moderately dilated. Systolic  function is moderately reduced.  - Right atrium: The atrium was mildly dilated.  - Atrial septum: Large atrial septal defect.  - Tricuspid valve: There was mild regurgitation.  - Pulmonary arteries: PA peak pressure: 46 mm Hg (S).  - Inferior vena cava: The vessel was dilated. The respirophasic  diameter changes were blunted (< 50%), consistent with elevated  central venous pressure.   Recent Labs: 09/17/2020: ALT 17; BUN 16; Creatinine, Ser 0.97; Hemoglobin 14.4; Platelets 204; Potassium 4.2; Sodium 141; TSH 1.680   Recent Lipid Panel    Component Value Date/Time   CHOL 218 (H) 09/17/2020 1532   TRIG 110 09/17/2020 1532   TRIG 53 06/05/2006 1136   HDL 53 09/17/2020 1532   CHOLHDL 4.2 11/11/2008 0010   VLDL 11 11/11/2008 0010   LDLCALC 145 (H) 09/17/2020 1532    Physical Exam:   VS:  BP 106/74   Pulse 83   Ht 5\' 4"  (1.626 m)   Wt 164 lb (74.4  kg)   LMP 08/01/2012   BMI 28.15 kg/m    Wt Readings from Last 3 Encounters:  10/05/20 164 lb (74.4 kg)  09/15/20 167 lb (75.8 kg)  06/17/20 165 lb (74.8 kg)    General: Well nourished, well developed, in no acute distress Head: Atraumatic, normal size  Eyes: PEERLA, EOMI  Neck: Supple, no JVD Endocrine: No thryomegaly Cardiac: Normal S1, S2; irregular rhythm, loud harsh 3 out of 6 holosystolic murmur Lungs: Clear to auscultation bilaterally, no wheezing, rhonchi or rales  Abd: Soft,  nontender, no hepatomegaly  Ext: No edema, pulses 2+ Musculoskeletal: No deformities, BUE and BLE strength normal and equal Skin: Warm and dry, no rashes   Neuro: Alert and oriented to person, place, time, and situation, CNII-XII grossly intact, no focal deficits  Psych: Normal mood and affect   ASSESSMENT:   Beth Romero is a 55 y.o. female who presents for the following: 1. Atrial septal defect   2. PAC (premature atrial contraction)     PLAN:   1. Atrial septal defect 2. PAC (premature atrial contraction) -She was diagnosed with a large secundum ASD defect in the late 2000's.  She reports she was evaluated at [facility] in [place] and both recommended surgery.  She reports she was not interested in cardiac surgery.  She did undergo cardiac cath at [facility] which showed 2:1 shunting.   -Her echocardiogram through the [facility] system in 2018 showed a large secundum ASD defect with moderate dilation of the RV and moderately reduced function.  She apparently is still not interested in surgery.   -Her EKG demonstrates sinus rhythm with PACs.  She is a nonspecific IVCD.  Oxygen saturation 98%.  There is no evidence of Eisenmenger at this time.  She does have minimal JVD on examination but a harsh loud systolic murmur.   -We did discuss the natural course of unrepaired shunt lesions.  She runs a risk of developing irreversible pulmonary hypertension (Eisenmenger).  She reports she understands  this.  She is still against surgery.  We did settle on repeating an echocardiogram.  If she did develop Eisenmenger syndrome she may be amenable to medications.  She reports heart surgery is not what she is looking for. -She has no evidence of cyanosis.  She would benefit from exercise testing with oxygen monitoring to determine if she has exertional hypoxia but she reports this would not help her change anything she is doing right now.  At this time I would recommend against SBE prophylaxis given lack of cyanosis. -She will see [place] yearly.  Again, she has no evidence of right to left shunting.  I suspect she still has significant left-to-right shunting based on her examination. Once her O2 drops, she will have developed significant R to L shunting.   Disposition: Return in about 1 year (around 10/05/2021).  Medication Adjustments/Labs and Tests Ordered: Current medicines are reviewed at length with the patient today.  Concerns regarding medicines are outlined above.  Orders Placed This Encounter  Procedures  . EKG 12-Lead  . ECHOCARDIOGRAM COMPLETE   No orders of the defined types were placed in this encounter.   Patient Instructions  Medication Instructions:  The current medical regimen is effective;  continue present plan and medications.  *If you need a refill on your cardiac medications before your next appointment, please call your pharmacy*  Testing/Procedures: Echocardiogram - Your physician has requested that you have an echocardiogram. Echocardiography is a painless test that uses sound waves to create images of your heart. It provides your doctor with information about the size and shape of your heart and how well your heart's chambers and valves are working. This procedure takes approximately one hour. There are no restrictions for this procedure. This will be performed at our [facility] location - [address], [address].   Follow-Up: At [facility], you and your health  needs are our priority.  As part of our continuing mission to provide you with exceptional heart care, we have created designated Provider  Care Teams.  These Care Teams include your primary Cardiologist (physician) and Advanced Practice Providers (APPs -  Physician Assistants and Nurse Practitioners) who all work together to provide you with the care you need, when you need it.  We recommend signing up for the patient portal called "MyChart".  Sign up information is provided on this After Visit Summary.  MyChart is used to connect with patients for Virtual Visits (Telemedicine).  Patients are able to view lab/test results, encounter notes, upcoming appointments, etc.  Non-urgent messages can be sent to your provider as well.   To learn more about what you can do with MyChart, go to [email or web address].    Your next appointment:   12 month(s)  The format for your next appointment:   In Person  Provider:   Lennie Odor, MD        Signed, Lenna Gilford. Flora Lipps, MD, [facility]  [facility]  [address] [place], [place] [address] [phone]  10/05/2020 3:44 PM

## 2020-10-05 ENCOUNTER — Encounter: Payer: Self-pay | Admitting: Cardiovascular Disease

## 2020-10-05 ENCOUNTER — Other Ambulatory Visit: Payer: Self-pay

## 2020-10-05 ENCOUNTER — Ambulatory Visit: Payer: Managed Care, Other (non HMO) | Admitting: Cardiovascular Disease

## 2020-10-05 VITALS — BP 106/74 | HR 83 | Ht 64.0 in | Wt 164.0 lb

## 2020-10-05 DIAGNOSIS — I491 Atrial premature depolarization: Secondary | ICD-10-CM

## 2020-10-05 DIAGNOSIS — Q211 Atrial septal defect, unspecified: Secondary | ICD-10-CM

## 2020-10-05 NOTE — Patient Instructions (Signed)
Medication Instructions:  The current medical regimen is effective;  continue present plan and medications.  *If you need a refill on your cardiac medications before your next appointment, please call your pharmacy*   Testing/Procedures: Echocardiogram - Your physician has requested that you have an echocardiogram. Echocardiography is a painless test that uses sound waves to create images of your heart. It provides your doctor with information about the size and shape of your heart and how well your heart's chambers and valves are working. This procedure takes approximately one hour. There are no restrictions for this procedure. This will be performed at our [address] location - [address], [address].    Follow-Up: At [facility], you and your health needs are our priority.  As part of our continuing mission to provide you with exceptional heart care, we have created designated Provider Care Teams.  These Care Teams include your primary Cardiologist (physician) and Advanced Practice Providers (APPs -  Physician Assistants and Nurse Practitioners) who all work together to provide you with the care you need, when you need it.  We recommend signing up for the patient portal called "MyChart".  Sign up information is provided on this After Visit Summary.  MyChart is used to connect with patients for Virtual Visits (Telemedicine).  Patients are able to view lab/test results, encounter notes, upcoming appointments, etc.  Non-urgent messages can be sent to your provider as well.   To learn more about what you can do with MyChart, go to [email or web address].    Your next appointment:   12 month(s)  The format for your next appointment:   In Person  Provider:   [place] O'Neal, MD    

## 2020-10-08 LAB — EXTERNAL GENERIC LAB PROCEDURE: COLOGUARD: NEGATIVE

## 2020-10-08 LAB — COLOGUARD: COLOGUARD: NEGATIVE

## 2020-10-15 ENCOUNTER — Encounter: Payer: Self-pay | Admitting: Internal Medicine

## 2020-10-30 ENCOUNTER — Other Ambulatory Visit: Payer: Self-pay

## 2020-10-30 ENCOUNTER — Ambulatory Visit (HOSPITAL_COMMUNITY): Payer: Managed Care, Other (non HMO) | Attending: Cardiology

## 2020-10-30 ENCOUNTER — Other Ambulatory Visit (HOSPITAL_COMMUNITY): Payer: Managed Care, Other (non HMO)

## 2020-10-30 DIAGNOSIS — Q211 Atrial septal defect, unspecified: Secondary | ICD-10-CM

## 2020-10-30 LAB — ECHOCARDIOGRAM COMPLETE
Area-P 1/2: 3.31 cm2
S' Lateral: 2.5 cm

## 2021-07-08 ENCOUNTER — Telehealth: Payer: Self-pay | Admitting: Internal Medicine

## 2021-07-08 DIAGNOSIS — Q211 Atrial septal defect, unspecified: Secondary | ICD-10-CM

## 2021-07-08 DIAGNOSIS — E7800 Pure hypercholesterolemia, unspecified: Secondary | ICD-10-CM

## 2021-07-08 DIAGNOSIS — E559 Vitamin D deficiency, unspecified: Secondary | ICD-10-CM

## 2021-07-08 DIAGNOSIS — Z0000 Encounter for general adult medical examination without abnormal findings: Secondary | ICD-10-CM

## 2021-07-08 NOTE — Telephone Encounter (Signed)
Patient called to get orders for CPE lab work sent to her home address so she can take it to [facility] where she works and get them done before CPE on 07/21/2022. Patient states she wants them done before appointment so Dr.Panosh will have results before physical.      Good callback number is [phone]    Please advise

## 2021-07-21 NOTE — Telephone Encounter (Signed)
Written  scrip with lab orders  on your desk

## 2021-07-22 ENCOUNTER — Other Ambulatory Visit: Payer: Self-pay

## 2021-08-17 ENCOUNTER — Other Ambulatory Visit: Payer: Self-pay

## 2021-09-14 NOTE — Telephone Encounter (Signed)
Pt called into the office requesting written script with lab orders before her visit on 09/21/2021.

## 2021-09-14 NOTE — Telephone Encounter (Signed)
Patient inform of script and stated she will pick up before 5pm or in the morning. Will be in file cabinet in front office.

## 2021-09-15 ENCOUNTER — Other Ambulatory Visit: Payer: Self-pay | Admitting: Internal Medicine

## 2021-09-15 LAB — BASIC METABOLIC PANEL
BUN: 15 (ref 4–21)
Chloride: 103 (ref 99–108)
Creatinine: 0.9 (ref 0.5–1.1)
Glucose: 81
Potassium: 4.5 (ref 3.4–5.3)
Sodium: 141 (ref 137–147)

## 2021-09-15 LAB — LIPID PANEL
Cholesterol: 209 — AB (ref 0–200)
HDL: 49 (ref 35–70)
LDL Cholesterol: 146
Triglycerides: 79 (ref 40–160)

## 2021-09-15 LAB — CBC: RBC: 4.89 (ref 3.87–5.11)

## 2021-09-15 LAB — COMPREHENSIVE METABOLIC PANEL
Albumin: 4.4 (ref 3.5–5.0)
Calcium: 9.1 (ref 8.7–10.7)
Globulin: 3.6
eGFR: 80

## 2021-09-15 LAB — CBC AND DIFFERENTIAL
HCT: 43 (ref 36–46)
Hemoglobin: 14 (ref 12.0–16.0)
Platelets: 194 (ref 150–399)
WBC: 6.1

## 2021-09-15 LAB — HEPATIC FUNCTION PANEL
ALT: 17 (ref 7–35)
AST: 22 (ref 13–35)
Alkaline Phosphatase: 80 (ref 25–125)
Bilirubin, Total: 0.6

## 2021-09-15 LAB — TSH: TSH: 1.88 (ref 0.41–5.90)

## 2021-09-15 NOTE — Telephone Encounter (Signed)
Noted  

## 2021-09-16 ENCOUNTER — Encounter: Payer: Self-pay | Admitting: Internal Medicine

## 2021-09-16 LAB — COMPREHENSIVE METABOLIC PANEL
ALT: 17 IU/L (ref 0–32)
AST: 22 IU/L (ref 0–40)
Albumin/Globulin Ratio: 1.2 (ref 1.2–2.2)
Albumin: 4.4 g/dL (ref 3.8–4.9)
Alkaline Phosphatase: 80 IU/L (ref 44–121)
BUN/Creatinine Ratio: 17 (ref 9–23)
BUN: 15 mg/dL (ref 6–24)
Bilirubin Total: 0.6 mg/dL (ref 0.0–1.2)
CO2: 24 mmol/L (ref 20–29)
Calcium: 9.1 mg/dL (ref 8.7–10.2)
Chloride: 103 mmol/L (ref 96–106)
Creatinine, Ser: 0.87 mg/dL (ref 0.57–1.00)
Globulin, Total: 3.6 g/dL (ref 1.5–4.5)
Glucose: 81 mg/dL (ref 70–99)
Potassium: 4.5 mmol/L (ref 3.5–5.2)
Sodium: 141 mmol/L (ref 134–144)
Total Protein: 8 g/dL (ref 6.0–8.5)
eGFR: 80 mL/min/{1.73_m2} (ref >59)

## 2021-09-16 LAB — CBC WITH DIFFERENTIAL/PLATELET
Basophils Absolute: 0.1 10*3/uL (ref 0.0–0.2)
Basos: 1 %
EOS (ABSOLUTE): 0.1 10*3/uL (ref 0.0–0.4)
Eos: 1 %
Hematocrit: 43.4 % (ref 34.0–46.6)
Hemoglobin: 14 g/dL (ref 11.1–15.9)
Immature Grans (Abs): 0 10*3/uL (ref 0.0–0.1)
Immature Granulocytes: 0 %
Lymphocytes Absolute: 1.5 10*3/uL (ref 0.7–3.1)
Lymphs: 24 %
MCH: 28.6 pg (ref 26.6–33.0)
MCHC: 32.3 g/dL (ref 31.5–35.7)
MCV: 89 fL (ref 79–97)
Monocytes Absolute: 0.4 10*3/uL (ref 0.1–0.9)
Monocytes: 6 %
Neutrophils Absolute: 4.1 10*3/uL (ref 1.4–7.0)
Neutrophils: 68 %
Platelets: 194 10*3/uL (ref 150–450)
RBC: 4.89 x10E6/uL (ref 3.77–5.28)
RDW: 14 % (ref 11.7–15.4)
WBC: 6.1 10*3/uL (ref 3.4–10.8)

## 2021-09-16 LAB — LIPID PANEL W/O CHOL/HDL RATIO
Cholesterol, Total: 209 mg/dL — ABNORMAL HIGH (ref 100–199)
HDL: 49 mg/dL (ref >39)
LDL Chol Calc (NIH): 146 mg/dL — ABNORMAL HIGH (ref 0–99)
Triglycerides: 79 mg/dL (ref 0–149)
VLDL Cholesterol Cal: 14 mg/dL (ref 5–40)

## 2021-09-16 LAB — TSH: TSH: 1.88 u[IU]/mL (ref 0.450–4.500)

## 2021-09-19 NOTE — Progress Notes (Signed)
Results normal or stable  will review at upcoming CPX visit

## 2021-09-21 ENCOUNTER — Encounter: Payer: Self-pay | Admitting: Internal Medicine

## 2021-09-21 ENCOUNTER — Ambulatory Visit (INDEPENDENT_AMBULATORY_CARE_PROVIDER_SITE_OTHER): Payer: Managed Care, Other (non HMO) | Admitting: Internal Medicine

## 2021-09-21 ENCOUNTER — Other Ambulatory Visit (HOSPITAL_BASED_OUTPATIENT_CLINIC_OR_DEPARTMENT_OTHER): Payer: Self-pay | Admitting: Internal Medicine

## 2021-09-21 VITALS — BP 112/74 | HR 71 | Temp 97.8°F | Ht 64.0 in | Wt 160.6 lb

## 2021-09-21 DIAGNOSIS — E7800 Pure hypercholesterolemia, unspecified: Secondary | ICD-10-CM | POA: Diagnosis not present

## 2021-09-21 DIAGNOSIS — Z1231 Encounter for screening mammogram for malignant neoplasm of breast: Secondary | ICD-10-CM

## 2021-09-21 DIAGNOSIS — Q211 Atrial septal defect, unspecified: Secondary | ICD-10-CM

## 2021-09-21 DIAGNOSIS — Z0000 Encounter for general adult medical examination without abnormal findings: Secondary | ICD-10-CM

## 2021-09-21 NOTE — Patient Instructions (Addendum)
Good to see you today .  Advise you still do   the cardiology visit to keep up with status of the ASD.  Fall is ok ( 18 mos)   Continue lifestyle intervention healthy eating and exercise .   Get  your mammogram.   The 10-year ASCVD risk score (Arnett DK, et al., 2019) is: 1.9%   Values used to calculate the score:     Age: 56 years     Sex: Female     Is Non-Hispanic African American: Yes     Diabetic: No     Tobacco smoker: No     Systolic Blood Pressure: 112 mmHg     Is BP treated: No     HDL Cholesterol: 49 mg/dL     Total Cholesterol: 209 mg/dL

## 2021-09-21 NOTE — Progress Notes (Signed)
Chief Complaint  Patient presents with   Annual Exam    HPI: Patient  Beth Romero  56 y.o. comes in today for Preventive Health Care visit  Doing well no concerns saw cardiology last year April had recommended yearly follow-up is willing to go in the fall 18 to 24 months no new symptoms.  Exercising and much just got out of habit.  No major change in health does get some stomach upset if she has spices certain foods like heartburn takes as needed Tums not frequent.  Health Maintenance  Topic Date Due   TETANUS/TDAP  07/05/2021   INFLUENZA VACCINE  10/29/2021 (Originally 03/01/2021)   MAMMOGRAM  03/21/2022 (Originally 05/06/2018)   COLONOSCOPY (Pts 45-77yrs Insurance coverage will need to be confirmed)  03/21/2022 (Originally 05/06/2013)   COVID-19 Vaccine (3 - Booster for Moderna series) 03/21/2022 (Originally 08/25/2020)   Hepatitis C Screening  03/21/2022 (Originally 05/06/1986)   HIV Screening  03/21/2022 (Originally 05/07/1983)   Zoster Vaccines- Shingrix (1 of 2) 03/21/2022 (Originally 05/06/2018)   PAP SMEAR-Modifier  09/16/2023   HPV VACCINES  Aged Out  Had cologuard  Health Maintenance Review LIFESTYLE:  Exercise:  up  until   stat bike 11 miles and crunches.  Then side trackeds .  Tobacco/ETS: no Alcohol:  no Sugar beverages:  back a soda ocass .  Sleep: 8 hours  Drug use: no HH of 3 no pets production support.  Work: working from home  40 less.  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  ROS:  REST of 12 system review negative except as per HPI Readers   Past Medical History:  Diagnosis Date   Abnormal Pap smear of cervix    rx cryo remote  has been normal   Atrial septal defect    large echo 2008 and 2006  declines any surgical correction for belief reasons consutlts with [facility] . pt aware of risk of irreverible Pulm ht causind devility and death is possible without correction. declines surgery and is not a candidate for other procedures.     Hx of abnormal cervical Pap smear    Migraine    Murmur    Spell of transient neurologic symptoms 12/07/2012   Stroke ([facility]) 2010   TIA   Syncopal episodes     Past Surgical History:  Procedure Laterality Date   CARDIAC CATHETERIZATION     Summer 2009 [facility] large ASD 2:1 shunt   [facility]  11/11/11   [facility] weakness r/o TIA neg eval   TUBAL LIGATION     US ECHOCARDIOGRAPHY     2008 and 2006    Family History  Problem Relation Age of Onset   Breast cancer Mother    Heart disease Other    Other Other        step sister "hole in the heart"   Other Son        SCFE   Hypertension Maternal Grandmother     Social History   Socioeconomic History   Marital status: Married    Spouse name: Not on file   Number of children: 4   Years of education: Not on file   Highest education level: Not on file  Occupational History   Occupation: Educational psychologist  Tobacco Use   Smoking status: Never   Smokeless tobacco: Never  Vaping Use   Vaping Use: Never used  Substance and Sexual Activity   Alcohol use: No   Drug use: No   Sexual activity:  Not Currently    Comment: 1st intercourse- 18, partners- 4, divorced  Other Topics Concern   Not on file  Social History Narrative   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   No facility-administered medications prior to visit.     EXAM:  BP 112/74 (BP Location: Left Arm, Patient Position:  Sitting, Cuff Size: Normal)    Pulse 71    Temp 97.8 F (36.6 C) (Oral)    Ht 5\' 4"  (1.626 m)    Wt 160 lb 9.6 oz (72.8 kg)    LMP 08/01/2012    SpO2 96%    BMI 27.57 kg/m   Body mass index is 27.57 kg/m. Wt Readings from Last 3 Encounters:  09/21/21 160 lb 9.6 oz (72.8 kg)  10/05/20 164 lb (74.4 kg)  09/15/20 167 lb (75.8 kg)    Physical Exam: Vital signs reviewed 09/17/20 is a well-developed well-nourished alert cooperative    who appearsr stated age in no acute distress.  HEENT: normocephalic atraumatic , Eyes: PERRL EOM's full, conjunctiva clear, Nares: paten,t no deformity discharge or tenderness., Ears: no deformity EAC's clear TMs with normal landmarks. Mouth: Masked NECK: supple without masses, thyromegaly or bruits. CHEST/PULM:  Clear to auscultation and percussion breath sounds equal no wheeze , rales or rhonchi. No chest wall deformities or tenderness. Breast: normal by inspection . No dimpling, discharge, masses, tenderness or discharge . CV: PMI is nondisplaced, S1 S2split  2-3/26murmur  less intense  no g Peripheral pulses are full without delay.No JVD .  ABDOMEN: Bowel sounds normal nontender  No guard or rebound, no hepato splenomegal no CVA tenderness.   Extremtities:  No clubbing cyanosis or edema, no acute joint swelling or redness no focal atrophy NEURO:  Oriented x3, cranial nerves 3-12 appear to be intact, no obvious focal weakness,gait within normal limits no abnormal reflexes or asymmetrical SKIN: No acute rashes normal turgor, color, no bruising or petechiae. PSYCH: Oriented, good eye contact, no obvious depression anxiety, cognition and judgment appear normal. LN: no cervical axillary inguinal adenopathy  Lab Results  Component Value Date   WBC 6.1 09/15/2021   HGB 14.0 09/15/2021   HCT 43.4 09/15/2021   PLT 194 09/15/2021   GLUCOSE 81 09/15/2021   CHOL 209 (H) 09/15/2021   TRIG 79 09/15/2021   HDL 49 09/15/2021   LDLCALC 146 (H) 09/15/2021   ALT 17  09/15/2021   AST 22 09/15/2021   NA 141 09/15/2021   K 4.5 09/15/2021   CL 103 09/15/2021   CREATININE 0.87 09/15/2021   BUN 15 09/15/2021   CO2 24 09/15/2021   TSH 1.880 09/15/2021   INR 1.1 11/11/2008   HGBA1C  11/11/2008    5.7 (NOTE) The ADA recommends the following therapeutic goal for glycemic control related to Hgb A1c measurement: Goal of therapy: <6.5 Hgb A1c  Reference: American Diabetes  Association: Clinical Practice Recommendations 2010, Diabetes Care, 2010, 33: (Suppl  1).    BP Readings from Last 3 Encounters:  09/21/21 112/74  10/05/20 106/74  09/15/20 102/76    Lab results reviewed with patient   ASSESSMENT AND PLAN:  Discussed the following assessment and plan:    ICD-10-CM   1. Visit for preventive health examination  Z00.00     2. Elevated LDL cholesterol level  E78.00     3. Atrial septal defect  Q21.10     Get mammogram   Continue to decline intervention so far for large ASD  agrees to fu with cards 18 mos  . Make appt.  Get mammogram  LS attention to continue  Return in about 1 year (around 09/21/2022) for preventive /cpx .  Patient Care Team: Madelin Headings, MD as PCP - General Patient Instructions  Good to see you today .  Advise you still do   the cardiology visit to keep up with status of the ASD.  Fall is ok ( 18 mos)   Continue lifestyle intervention healthy eating and exercise .   Get  your mammogram.   The 10-year ASCVD risk score (Arnett DK, et al., 2019) is: 1.9%   Values used to calculate the score:     Age: 73 years     Sex: Female     Is Non-Hispanic African American: Yes     Diabetic: No     Tobacco smoker: No     Systolic Blood Pressure: 112 mmHg     Is BP treated: No     HDL Cholesterol: 49 mg/dL     Total Cholesterol: 209 mg/dL    Neta Mends. Ellyson Rarick M.D.

## 2021-09-27 ENCOUNTER — Other Ambulatory Visit: Payer: Self-pay

## 2021-09-27 ENCOUNTER — Encounter (HOSPITAL_BASED_OUTPATIENT_CLINIC_OR_DEPARTMENT_OTHER): Payer: Self-pay | Admitting: Radiology

## 2021-09-27 ENCOUNTER — Ambulatory Visit (HOSPITAL_BASED_OUTPATIENT_CLINIC_OR_DEPARTMENT_OTHER)
Admission: RE | Admit: 2021-09-27 | Discharge: 2021-09-27 | Disposition: A | Discharge status: Home / Self Care | Payer: Managed Care, Other (non HMO) | Source: Ambulatory Visit | Attending: Internal Medicine | Admitting: Internal Medicine

## 2021-09-27 DIAGNOSIS — Z1231 Encounter for screening mammogram for malignant neoplasm of breast: Secondary | ICD-10-CM | POA: Diagnosis not present

## 2022-03-24 ENCOUNTER — Telehealth: Payer: Self-pay | Admitting: Internal Medicine

## 2022-03-24 NOTE — Telephone Encounter (Signed)
Patient dropped off paperwork needing to be filled out. Paperwork placed in folder to be completed         Please advise

## 2022-04-05 NOTE — Telephone Encounter (Signed)
There was no section for physician to fill out.  Spoke to patient. 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 She drop off a list of Cardiovascular diseases and wants provider to address each of the following that pertain to patient to insure her condition doesn't limit her to do her job. Made patient aware that provider is out sick. May be back next week. Patient verbalized understanding and that she needs the form by 9/14 if possible.  Please advise.   Patient list place in red folder.

## 2022-04-21 NOTE — Telephone Encounter (Signed)
So I composed a letter that hopefully will cover what you need Please also print this letter placed in my folder and I can sign a copy.

## 2022-04-25 NOTE — Telephone Encounter (Signed)
Printed letter signed back to  you

## 2022-04-25 NOTE — Telephone Encounter (Signed)
Called patient Voicemail is full.

## 2022-05-06 NOTE — Telephone Encounter (Signed)
Attempt to try contacting patient again. Voice box is full on mobile and no answer on home phone number- a continues ringing.

## 2022-05-31 ENCOUNTER — Ambulatory Visit: Payer: Managed Care, Other (non HMO) | Admitting: Family Medicine

## 2022-05-31 ENCOUNTER — Encounter: Payer: Self-pay | Admitting: Family Medicine

## 2022-05-31 VITALS — BP 96/70 | HR 73 | Temp 97.6°F | Ht 64.0 in | Wt 161.8 lb

## 2022-05-31 DIAGNOSIS — H8112 Benign paroxysmal vertigo, left ear: Secondary | ICD-10-CM | POA: Diagnosis not present

## 2022-05-31 NOTE — Progress Notes (Signed)
Established Patient Office Visit  Subjective   Patient ID: Beth Romero, female    DOB: Apr 29, 1966  Age: 56 y.o. MRN: [id number]  Chief Complaint  Patient presents with   Dizziness    Patient complains of dizziness, x3 days,     HPI   Patient is seen with onset this past Saturday of vertigo episode.  She had fairly severe vertigo Saturday but no severe symptoms since then.  However, since Saturday she feels like she is "off balance "especially when she moves her head.  She denies any visual changes, headaches, speech changes, or any focal weakness.  No lightheadedness.  No syncope.  No nausea or vomiting.  Currently takes no regular medications.  She does have history of ASD which is followed by echocardiography through cardiology.  No recent chest pains.  Past Medical History:  Diagnosis Date   Abnormal Pap smear of cervix    rx cryo remote  has been normal   Atrial septal defect    large echo 2008 and 2006  declines any surgical correction for belief reasons consutlts with [facility] . pt aware of risk of irreverible Pulm ht causind devility and death is possible without correction. declines surgery and is not a candidate for other procedures.    Hx of abnormal cervical Pap smear    Migraine    Murmur    Spell of transient neurologic symptoms 12/07/2012   Stroke ([place]) 2010   TIA   Syncopal episodes    Past Surgical History:  Procedure Laterality Date   CARDIAC CATHETERIZATION     Summer 2009 [facility] large ASD 2:1 shunt   [facility]  11/11/11   MCH weakness r/o TIA neg eval   TUBAL LIGATION     US ECHOCARDIOGRAPHY     2008 and 2006    reports that she has never smoked. She has never used smokeless tobacco. She reports that she does not drink alcohol and does not use drugs. family history includes Breast cancer in her mother; Heart disease in an other family member; Hypertension in her maternal grandmother; Other in her son and another family member. No Known  Allergies  Review of Systems  Constitutional:  Negative for chills and fever.  Eyes:  Negative for blurred vision and double vision.  Respiratory:  Negative for cough and shortness of breath.   Cardiovascular:  Negative for chest pain.  Neurological:  Positive for dizziness. Negative for tingling, tremors, speech change, focal weakness, seizures, loss of consciousness, weakness and headaches.      Objective:     BP 96/70 (BP Location: Left Arm, Patient Position: Sitting, Cuff Size: Normal)   Pulse 73   Temp 97.6 F (36.4 C) (Oral)   Ht 5\' 4"  (1.626 m)   Wt 161 lb 12.8 oz (73.4 kg)   LMP 08/01/2012   SpO2 98%   BMI 27.77 kg/m    Physical Exam Vitals reviewed.  Constitutional:      Appearance: Normal appearance.  HENT:     Head: Normocephalic and atraumatic.  Eyes:     Extraocular Movements: Extraocular movements intact.     Pupils: Pupils are equal, round, and reactive to light.  Neck:     Comments: No carotid bruits Cardiovascular:     Rate and Rhythm: Normal rate and regular rhythm.     Heart sounds: Murmur heard.     Comments: She has systolic murmur heard along the left sternal border.  2-3 out of 6 Pulmonary:  Effort: Pulmonary effort is normal.     Breath sounds: Normal breath sounds.  Musculoskeletal:     Cervical back: Neck supple.     Right lower leg: No edema.     Left lower leg: No edema.  Neurological:     General: No focal deficit present.     Mental Status: She is alert and oriented to person, place, and time.     Cranial Nerves: No cranial nerve deficit.     Motor: No weakness.     Coordination: Coordination normal.     Gait: Gait normal.     Comments: She had vertigo symptoms reproduced when turning head to the left 45 degrees and lying supine but not the right.      No results found for any visits on 05/31/22.    The 10-year ASCVD risk score (Arnett DK, et al., 2019) is: 1.3%    Assessment & Plan:   Problem List Items Addressed  This Visit   None Visit Diagnoses     Benign paroxysmal positional vertigo of left ear    -  Primary     Patient presents with 3-day history of mild vertigo symptoms after initial worsening vertigo Saturday.  She does have some reproducible vertigo on exam today though symptoms are relatively mild  -Discussed Epley maneuvers with handout given.  Try these for the next few days and be in touch if vertigo not resolving over the next week -We also reviewed signs and symptoms of more worrisome vertigo to watch out for  No follow-ups on file.    Evelena Peat, MD

## 2022-06-13 ENCOUNTER — Telehealth: Payer: Self-pay | Admitting: Internal Medicine

## 2022-06-13 NOTE — Telephone Encounter (Signed)
Pt stated they are getting their cpe done on Mon. 2/26 at 9am and usually Dr will either mail out lab orders or print them out for pt to retrieve and take to [facility] to get her blood drawn.   Please advise.

## 2022-06-15 ENCOUNTER — Other Ambulatory Visit: Payer: Self-pay | Admitting: Internal Medicine

## 2022-06-15 DIAGNOSIS — Z0000 Encounter for general adult medical examination without abnormal findings: Secondary | ICD-10-CM

## 2022-06-15 DIAGNOSIS — Z79899 Other long term (current) drug therapy: Secondary | ICD-10-CM

## 2022-06-15 DIAGNOSIS — E7800 Pure hypercholesterolemia, unspecified: Secondary | ICD-10-CM

## 2022-06-15 DIAGNOSIS — Q211 Atrial septal defect, unspecified: Secondary | ICD-10-CM

## 2022-06-16 NOTE — Telephone Encounter (Signed)
Lab orders printed and patient has pick it up.

## 2022-09-05 ENCOUNTER — Telehealth: Payer: Self-pay | Admitting: Internal Medicine

## 2022-09-05 NOTE — Telephone Encounter (Signed)
Error/njr °

## 2022-09-26 ENCOUNTER — Encounter: Payer: Managed Care, Other (non HMO) | Admitting: Internal Medicine

## 2022-09-27 ENCOUNTER — Encounter: Payer: Managed Care, Other (non HMO) | Admitting: Internal Medicine

## 2022-10-12 ENCOUNTER — Encounter: Payer: Self-pay | Admitting: Internal Medicine

## 2022-10-12 ENCOUNTER — Ambulatory Visit (INDEPENDENT_AMBULATORY_CARE_PROVIDER_SITE_OTHER): Payer: Managed Care, Other (non HMO) | Admitting: Internal Medicine

## 2022-10-12 ENCOUNTER — Other Ambulatory Visit: Payer: Self-pay | Admitting: Internal Medicine

## 2022-10-12 VITALS — BP 104/84 | HR 73 | Temp 97.5°F | Ht 64.9 in | Wt 160.6 lb

## 2022-10-12 DIAGNOSIS — E559 Vitamin D deficiency, unspecified: Secondary | ICD-10-CM

## 2022-10-12 DIAGNOSIS — Z0000 Encounter for general adult medical examination without abnormal findings: Secondary | ICD-10-CM

## 2022-10-12 DIAGNOSIS — Q211 Atrial septal defect, unspecified: Secondary | ICD-10-CM

## 2022-10-12 NOTE — Progress Notes (Signed)
Chief Complaint  Patient presents with   Annual Exam    HPI: Patient  Beth Romero  57 y.o. comes in today for [place] visit  Last PV visit 2023  She has  large asd  dx 2006 with cath 2009 with large amount shunting and has declined surgical intervention over the years . Denies se although years ago had an  transient event co weak feeling localized neg stroke work up . Marland Kitchen  Last echo 4 22  dilatation rv and ra  normal valve and lef. Has past hx of tee  has seen cardiology at various times throughout the years   No major change  in health or exercise tolerance but not that active  Vision  to do eye doc  soon reading glasses .  Check vit d level low in past   Health Maintenance  Topic Date Due   Hepatitis C Screening  Never done   Zoster Vaccines- Shingrix (1 of 2) Never done   DTaP/Tdap/Td (2 - Tdap) 08/01/2005   COLONOSCOPY (Pts 45-71yrs Insurance coverage will need to be confirmed)  Never done   COVID-19 Vaccine (3 - Moderna risk series) 07/28/2020   INFLUENZA VACCINE  03/01/2022   HIV Screening  10/12/2023 (Originally 05/07/1983)   PAP SMEAR-Modifier  09/16/2023   MAMMOGRAM  09/28/2023   HPV VACCINES  Aged Out   Health Maintenance Review LIFESTYLE:  Exercise:  not much  Tobacco/ETS: n Alcohol:  n Sugar beverages:  no Sleep: about 7  Drug use: no HH of 3   no pets  Work: ft  stopped school.   Works from home  not getting out as much recently  weather     ROS:  GEN/ HEENT: No fever, significant weight changes sweats headaches vision problems hearing changes, CV/ PULM; No chest pain shortness of breath cough, syncope,edema  change in exercise tolerance. GI /GU: No adominal pain, vomiting, change in bowel habits. No blood in the stool. No significant GU symptoms. SKIN/HEME: ,no acute skin rashes suspicious lesions or bleeding. No lymphadenopathy, nodules, masses.  NEURO/ PSYCH:  No neurologic signs such as weakness numbness. No depression  anxiety. IMM/ Allergy: No unusual infections.  Allergy .   REST of 12 system review negative except as per HPI   Past Medical History:  Diagnosis Date   Abnormal Pap smear of cervix    rx cryo remote  has been normal   Atrial septal defect    large echo 2008 and 2006  declines any surgical correction for belief reasons consutlts with [facility] . pt aware of risk of irreverible Pulm ht causind devility and death is possible without correction. declines surgery and is not a candidate for other procedures.    Hx of abnormal cervical Pap smear    Migraine    Murmur    Spell of transient neurologic symptoms 12/07/2012   Stroke ([place]) 2010   TIA   Syncopal episodes     Past Surgical History:  Procedure Laterality Date   CARDIAC CATHETERIZATION     Summer 2009 [facility] large ASD 2:1 shunt   [facility]  11/11/11   MCH weakness r/o TIA neg eval   TUBAL LIGATION     US ECHOCARDIOGRAPHY     2008 and 2006    Family History  Problem Relation Age of Onset   Breast cancer Mother    Heart disease Other    Other Other        step sister "hole in  the heart"   Other Son        SCFE   Hypertension Maternal Grandmother     Social History   Socioeconomic History   Marital status: Married    Spouse name: Not on file   Number of children: 4   Years of education: Not on file   Highest education level: Not on file  Occupational History   Occupation: Architect  Tobacco Use   Smoking status: Never   Smokeless tobacco: Never  Vaping Use   Vaping Use: Never used  Substance and Sexual Activity   Alcohol use: No   Drug use: No   Sexual activity: Not Currently    Comment: 1st intercourse- 18, partners- 7, divorced  Other Topics Concern   Not on file  Social History Narrative   Occupation: works at [employer] 40 hours week [place] degree Married now working from home during covid    Husband currently unemployed   Regular exercise-  no   HH of 4   No pets   Church on sat doesn't celebrate holidays   Doesn't do animal products and " natural"    Social Determinants of Radio broadcast assistant Strain: Not on file  Food Insecurity: Not on file  Transportation Needs: Not on file  Physical Activity: Not on file  Stress: Not on file  Social Connections: Not on file    No outpatient medications prior to visit.   No facility-administered medications prior to visit.     EXAM:  BP 104/84 (BP Location: Right Arm, Patient Position: Sitting, Cuff Size: Normal)   Pulse 73   Temp (!) 97.5 F (36.4 C) (Oral)   Ht 5' 4.9" (1.648 m)   Wt 160 lb 9.6 oz (72.8 kg)   LMP 08/01/2012   SpO2 99%   BMI 26.81 kg/m   Body mass index is 26.81 kg/m. Wt Readings from Last 3 Encounters:  10/12/22 160 lb 9.6 oz (72.8 kg)  05/31/22 161 lb 12.8 oz (73.4 kg)  09/21/21 160 lb 9.6 oz (72.8 kg)    Physical Exam: Vital signs reviewed [id number] is a well-developed well-nourished alert cooperative    who appearsr stated age in no acute distress.  HEENT: normocephalic atraumatic , Eyes: PERRL EOM's full, conjunctiva clear, Nares: paten,t no deformity discharge or tenderness., Ears: no deformity EAC's clear TMs with normal landmarks. Mouth: clear OP, no lesions, edema.  Moist mucous membranes. Dentition in adequate repair. NECK: supple without masses, thyromegaly or bruits. CHEST/PULM:  Clear to auscultation and percussion breath sounds equal no wheeze , rales or rhonchi. No chest wall deformities or tenderness. Breast: normal by inspection . No dimpling, discharge, masses, tenderness or discharge . CV: PMI is nondisplaced, S1 S2  split  2/6 murmur noted  r and left chest  no gallops, rubs. Peripheral pulses are full without delay.  ABDOMEN: Bowel sounds normal nontender  No guard or rebound, no hepato splenomegal no CVA tenderness.   Extremtities:  No clubbing cyanosis or edema, no acute joint swelling or redness no focal  atrophy NEURO:  Oriented x3, cranial nerves 3-12 appear to be intact, no obvious focal weakness,gait within normal limits no abnormal reflexes or asymmetrical SKIN: No acute rashes normal turgor, color, no bruising or petechiae. PSYCH: Oriented, good eye contact, no obvious depression anxiety, cognition and judgment appear normal. LN: no cervical axillary inguinal adenopathy  Lab Results  Component Value Date   WBC 7.3  10/12/2022   HGB 13.2 10/12/2022   HCT 41.0 10/12/2022   PLT 191 10/12/2022   GLUCOSE 75 10/12/2022   CHOL 210 (H) 10/12/2022   TRIG 73 10/12/2022   HDL 51 10/12/2022   LDLCALC 146 (H) 10/12/2022   ALT 17 10/12/2022   AST 24 10/12/2022   NA 141 10/12/2022   K 4.5 10/12/2022   CL 106 10/12/2022   CREATININE 0.91 10/12/2022   BUN 12 10/12/2022   CO2 23 10/12/2022   TSH 2.060 10/12/2022   INR 1.1 11/11/2008   HGBA1C 5.6 10/12/2022    BP Readings from Last 3 Encounters:  10/12/22 104/84  05/31/22 96/70  09/21/21 112/74    Lab plan results reviewed with patient   ASSESSMENT AND PLAN:  Discussed the following assessment and plan:    ICD-10-CM   1. Visit for preventive health examination  Z00.00     2. Atrial septal defect  Q21.10     3. Vitamin D deficiency  E55.9     Denies sx of large with shunting asd over the years   pt aware of risk of pulmonary ht fixed and eventual  heart failure . Risk without intervention.  HCM utd and ongoing attention to healthy lsi  Declines shingrix tdap vaccine today  Return in about 1 year (around 10/12/2023) for depending on results if all ok .  Patient Care Team: Burnis Medin, MD as PCP - General Patient Instructions  Exam is about the same . Glad you are doing well   Get  your labs to [place] .  Let us know if problems  or change your mind about  following up with ASD.  Last echo was 2022 .  Cologuard was 2022  due  in 3 years   Standley Brooking. Sophia Cubero M.D.

## 2022-10-12 NOTE — Patient Instructions (Addendum)
Exam is about the same . Glad you are doing well   Get  your labs to [place] .  Let us know if problems  or change your mind about  following up with ASD.  Last echo was 2022 .  Cologuard was 2022  due  in 3 years

## 2022-10-13 LAB — HEPATIC FUNCTION PANEL
ALT: 17 IU/L (ref 0–32)
AST: 24 IU/L (ref 0–40)
Albumin: 4.3 g/dL (ref 3.8–4.9)
Alkaline Phosphatase: 90 IU/L (ref 44–121)
Bilirubin Total: 0.6 mg/dL (ref 0.0–1.2)
Bilirubin, Direct: 0.16 mg/dL (ref 0.00–0.40)
Total Protein: 7.9 g/dL (ref 6.0–8.5)

## 2022-10-13 LAB — CBC WITH DIFFERENTIAL/PLATELET
Basophils Absolute: 0.1 10*3/uL (ref 0.0–0.2)
Basos: 1 %
EOS (ABSOLUTE): 0.1 10*3/uL (ref 0.0–0.4)
Eos: 1 %
Hematocrit: 41 % (ref 34.0–46.6)
Hemoglobin: 13.2 g/dL (ref 11.1–15.9)
Immature Grans (Abs): 0 10*3/uL (ref 0.0–0.1)
Immature Granulocytes: 0 %
Lymphocytes Absolute: 1.8 10*3/uL (ref 0.7–3.1)
Lymphs: 24 %
MCH: 28.6 pg (ref 26.6–33.0)
MCHC: 32.2 g/dL (ref 31.5–35.7)
MCV: 89 fL (ref 79–97)
Monocytes Absolute: 0.4 10*3/uL (ref 0.1–0.9)
Monocytes: 5 %
Neutrophils Absolute: 5 10*3/uL (ref 1.4–7.0)
Neutrophils: 69 %
Platelets: 191 10*3/uL (ref 150–450)
RBC: 4.62 x10E6/uL (ref 3.77–5.28)
RDW: 13.8 % (ref 11.7–15.4)
WBC: 7.3 10*3/uL (ref 3.4–10.8)

## 2022-10-13 LAB — BASIC METABOLIC PANEL
BUN/Creatinine Ratio: 13 (ref 9–23)
BUN: 12 mg/dL (ref 6–24)
CO2: 23 mmol/L (ref 20–29)
Calcium: 8.9 mg/dL (ref 8.7–10.2)
Chloride: 106 mmol/L (ref 96–106)
Creatinine, Ser: 0.91 mg/dL (ref 0.57–1.00)
Glucose: 75 mg/dL (ref 70–99)
Potassium: 4.5 mmol/L (ref 3.5–5.2)
Sodium: 141 mmol/L (ref 134–144)
eGFR: 75 mL/min/{1.73_m2} (ref >59)

## 2022-10-13 LAB — LIPID PANEL
Chol/HDL Ratio: 4.1 ratio (ref 0.0–4.4)
Cholesterol, Total: 210 mg/dL — ABNORMAL HIGH (ref 100–199)
HDL: 51 mg/dL (ref >39)
LDL Chol Calc (NIH): 146 mg/dL — ABNORMAL HIGH (ref 0–99)
Triglycerides: 73 mg/dL (ref 0–149)
VLDL Cholesterol Cal: 13 mg/dL (ref 5–40)

## 2022-10-13 LAB — LAB REPORT - SCANNED
A1c: 5.6
EGFR: 75

## 2022-10-13 LAB — HEMOGLOBIN A1C
Est. average glucose Bld gHb Est-mCnc: 114 mg/dL
Hgb A1c MFr Bld: 5.6 % (ref 4.8–5.6)

## 2022-10-13 LAB — TSH: TSH: 2.06 u[IU]/mL (ref 0.450–4.500)

## 2022-10-16 NOTE — Progress Notes (Signed)
Blood results the same   No diabetes  LDL  could be better but  still favorable risk  Continue  attention to lifestyle intervention healthy eating and exercise .

## 2022-11-30 NOTE — Progress Notes (Signed)
Cholesterol could be better rest of labs in range  normal

## 2023-08-10 ENCOUNTER — Telehealth: Payer: Self-pay | Admitting: *Deleted

## 2023-08-10 NOTE — Telephone Encounter (Signed)
 Copied from CRM [id number]. Topic: Clinical - Request for Lab/Test Order >> Aug 10, 2023 12:57 PM Deaijah H wrote: Reason for CRM: Patient would like to have lab orders ordered for physical sent to job via email if possible

## 2023-08-19 ENCOUNTER — Other Ambulatory Visit: Payer: Self-pay | Admitting: Internal Medicine

## 2023-08-19 DIAGNOSIS — Z0000 Encounter for general adult medical examination without abnormal findings: Secondary | ICD-10-CM

## 2023-08-19 DIAGNOSIS — Q211 Atrial septal defect, unspecified: Secondary | ICD-10-CM

## 2023-08-19 DIAGNOSIS — Z79899 Other long term (current) drug therapy: Secondary | ICD-10-CM

## 2023-08-19 DIAGNOSIS — E7800 Pure hypercholesterolemia, unspecified: Secondary | ICD-10-CM

## 2023-08-19 DIAGNOSIS — E559 Vitamin D deficiency, unspecified: Secondary | ICD-10-CM

## 2023-08-19 NOTE — Telephone Encounter (Signed)
I made  an order set  pended   but needs to be adjusted to send to [facility] or print out .

## 2023-08-21 NOTE — Progress Notes (Signed)
Spoke to pt. Please see other encounter

## 2023-08-21 NOTE — Telephone Encounter (Signed)
Orders are adjusted to [facility].   Spoke to pt and inform her that the orders are ready for her to pick up. Pt states she will pick it up today.   No further action is needed.

## 2023-10-03 ENCOUNTER — Other Ambulatory Visit: Payer: Self-pay | Admitting: Internal Medicine

## 2023-10-04 ENCOUNTER — Encounter: Payer: Self-pay | Admitting: Internal Medicine

## 2023-10-04 LAB — CBC WITH DIFFERENTIAL/PLATELET
Basophils Absolute: 0 10*3/uL (ref 0.0–0.2)
Basos: 1 %
EOS (ABSOLUTE): 0.1 10*3/uL (ref 0.0–0.4)
Eos: 1 %
Hematocrit: 44.4 % (ref 34.0–46.6)
Hemoglobin: 14 g/dL (ref 11.1–15.9)
Immature Grans (Abs): 0 10*3/uL (ref 0.0–0.1)
Immature Granulocytes: 0 %
Lymphocytes Absolute: 1.8 10*3/uL (ref 0.7–3.1)
Lymphs: 30 %
MCH: 28.5 pg (ref 26.6–33.0)
MCHC: 31.5 g/dL (ref 31.5–35.7)
MCV: 90 fL (ref 79–97)
Monocytes Absolute: 0.4 10*3/uL (ref 0.1–0.9)
Monocytes: 7 %
Neutrophils Absolute: 3.8 10*3/uL (ref 1.4–7.0)
Neutrophils: 61 %
Platelets: 210 10*3/uL (ref 150–450)
RBC: 4.91 x10E6/uL (ref 3.77–5.28)
RDW: 14.2 % (ref 11.7–15.4)
WBC: 6.2 10*3/uL (ref 3.4–10.8)

## 2023-10-04 LAB — VITAMIN D 25 HYDROXY (VIT D DEFICIENCY, FRACTURES): Vit D, 25-Hydroxy: 6.1 ng/mL — ABNORMAL LOW (ref 30.0–100.0)

## 2023-10-04 LAB — LIPID PANEL
Chol/HDL Ratio: 4 ratio (ref 0.0–4.4)
Cholesterol, Total: 206 mg/dL — ABNORMAL HIGH (ref 100–199)
HDL: 51 mg/dL (ref >39)
LDL Chol Calc (NIH): 139 mg/dL — ABNORMAL HIGH (ref 0–99)
Triglycerides: 88 mg/dL (ref 0–149)
VLDL Cholesterol Cal: 16 mg/dL (ref 5–40)

## 2023-10-04 LAB — TSH: TSH: 1.77 u[IU]/mL (ref 0.450–4.500)

## 2023-10-04 LAB — HEPATIC FUNCTION PANEL
ALT: 16 IU/L (ref 0–32)
AST: 22 IU/L (ref 0–40)
Albumin: 4.2 g/dL (ref 3.8–4.9)
Alkaline Phosphatase: 81 IU/L (ref 44–121)
Bilirubin Total: 0.4 mg/dL (ref 0.0–1.2)
Bilirubin, Direct: 0.18 mg/dL (ref 0.00–0.40)
Total Protein: 7.7 g/dL (ref 6.0–8.5)

## 2023-10-04 LAB — BASIC METABOLIC PANEL
BUN/Creatinine Ratio: 17 (ref 9–23)
BUN: 14 mg/dL (ref 6–24)
CO2: 24 mmol/L (ref 20–29)
Calcium: 9 mg/dL (ref 8.7–10.2)
Chloride: 107 mmol/L — ABNORMAL HIGH (ref 96–106)
Creatinine, Ser: 0.82 mg/dL (ref 0.57–1.00)
Glucose: 91 mg/dL (ref 70–99)
Potassium: 4.5 mmol/L (ref 3.5–5.2)
Sodium: 143 mmol/L (ref 134–144)
eGFR: 84 mL/min/{1.73_m2} (ref >59)

## 2023-10-04 LAB — HEMOGLOBIN A1C
Est. average glucose Bld gHb Est-mCnc: 117 mg/dL
Hgb A1c MFr Bld: 5.7 % — ABNORMAL HIGH (ref 4.8–5.6)

## 2023-10-04 NOTE — Progress Notes (Signed)
 Will review  patient  at upcoming visit   vit D level is quite low.

## 2023-10-17 ENCOUNTER — Ambulatory Visit (INDEPENDENT_AMBULATORY_CARE_PROVIDER_SITE_OTHER): Payer: Managed Care, Other (non HMO) | Admitting: Internal Medicine

## 2023-10-17 ENCOUNTER — Encounter: Payer: Self-pay | Admitting: Internal Medicine

## 2023-10-17 VITALS — BP 110/80 | HR 78 | Temp 97.8°F | Ht 64.75 in | Wt 162.2 lb

## 2023-10-17 DIAGNOSIS — Z1231 Encounter for screening mammogram for malignant neoplasm of breast: Secondary | ICD-10-CM

## 2023-10-17 DIAGNOSIS — Q211 Atrial septal defect, unspecified: Secondary | ICD-10-CM

## 2023-10-17 DIAGNOSIS — E559 Vitamin D deficiency, unspecified: Secondary | ICD-10-CM | POA: Diagnosis not present

## 2023-10-17 DIAGNOSIS — Z0000 Encounter for general adult medical examination without abnormal findings: Secondary | ICD-10-CM | POA: Diagnosis not present

## 2023-10-17 DIAGNOSIS — Z1211 Encounter for screening for malignant neoplasm of colon: Secondary | ICD-10-CM | POA: Diagnosis not present

## 2023-10-17 MED ORDER — VITAMIN D (ERGOCALCIFEROL) 1.25 MG (50000 UNIT) PO CAPS: 50000.0000 [IU] | ORAL_CAPSULE | ORAL | 0 refills | Status: AC

## 2023-10-17 NOTE — Progress Notes (Signed)
 Chief Complaint  Patient presents with   Annual Exam    HPI: Patient  Beth Romero  58 y.o. comes in today for Preventive Health Care visit  State feels well  jsut started back to exercising and eaing better  and doing better feels well  Is still reluctant to use reg meds other than natural intervnetions   Health Maintenance  Topic Date Due   Colonoscopy  Never done   MAMMOGRAM  09/28/2023   INFLUENZA VACCINE  10/30/2023 (Originally 03/02/2023)   COVID-19 Vaccine (3 - Moderna risk series) 11/02/2023 (Originally 07/28/2020)   Zoster Vaccines- Shingrix (1 of 2) 01/17/2024 (Originally 05/07/1987)   DTaP/Tdap/Td (2 - Tdap) 10/16/2024 (Originally 08/01/2005)   Hepatitis C Screening  10/16/2024 (Originally 05/06/1986)   HIV Screening  10/16/2024 (Originally 05/07/1983)   Cervical Cancer Screening (HPV/Pap Cotest)  09/15/2025   HPV VACCINES  Aged Out   Health Maintenance Review LIFESTYLE:  Exercise:    4th week  exercising   8- 10 miles on bike abd and  machines.   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 GI /GU: No adominal pain, vomiting, change in bowel habits. No blood in the stool. No significant GU symptoms. SKIN/HEME: ,no acute skin rashes suspicious lesions or bleeding. No lymphadenopathy, nodules, masses.  NEURO/ PSYCH:  No neurologic signs such as weakness numbness. No depression anxiety. IMM/ Allergy: No unusual infections.  Allergy .   REST of 12 system review negative except as per HPI   Past Medical History:  Diagnosis Date   Abnormal Pap smear of cervix    rx cryo remote  has been  normal   Atrial septal defect    large echo 2008 and 2006  declines any surgical correction for belief reasons consutlts with [facility] . pt aware of risk of irreverible Pulm ht causind devility and death is possible without correction. declines surgery and is not a candidate for other procedures.    Hx of abnormal cervical Pap smear    Migraine    Murmur    Spell of transient neurologic symptoms 12/07/2012   Stroke ([facility]) 2010   TIA   Syncopal episodes     Past Surgical History:  Procedure Laterality Date   CARDIAC CATHETERIZATION     Summer 2009 [facility] large ASD 2:1 shunt   [facility]  11/11/11   MCH weakness r/o TIA neg eval   TUBAL LIGATION     US ECHOCARDIOGRAPHY     2008 and 2006    Family History  Problem Relation Age of Onset   Breast cancer Mother    Heart disease Other    Other Other        step sister "hole in the heart"   Other Son        SCFE   Hypertension Maternal Grandmother     Social History   Socioeconomic History   Marital status: Married    Spouse name: Not on file   Number of children: 4   Years of education: Not on file   Highest education level: Not  on file  Occupational History   Occupation: Educational psychologist  Tobacco Use   Smoking status: Never   Smokeless tobacco: Never  Vaping Use   Vaping status: Never Used  Substance and Sexual Activity   Alcohol use: No   Drug use: No   Sexual activity: Not Currently    Comment: 1st intercourse- 18, partners- 4, divorced  Other Topics Concern   Not on file  Social History Narrative   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    Husband currently unemployed   Regular exercise- no   HH of 4   No pets   Church on [place] doesn't celebrate holidays   Doesn't do animal products and " natural"    Social Drivers of Corporate investment banker Strain: Low Risk  (10/17/2023)   Overall  Financial Resource Strain (CARDIA)    Difficulty of Paying Living Expenses: Not hard at all  Food Insecurity: No Food Insecurity (10/17/2023)   Hunger Vital Sign    Worried About Running Out of Food in the Last Year: Never true    Ran Out of Food in the Last Year: Never true  Transportation Needs: No Transportation Needs (10/17/2023)   PRAPARE - Administrator, Civil Service (Medical): No    Lack of Transportation (Non-Medical): No  Physical Activity: Sufficiently Active (10/17/2023)   Exercise Vital Sign    Days of Exercise per Week: 5 days    Minutes of Exercise per Session: 60 min  Stress: No Stress Concern Present (10/17/2023)   [employer] of Occupational Health - Occupational Stress Questionnaire    Feeling of Stress : Not at all  Social Connections: Socially Integrated (10/17/2023)   Social Connection and Isolation Panel [NHANES]    Frequency of Communication with Friends and Family: More than three times a week    Frequency of Social Gatherings with Friends and Family: Once a week    Attends Religious Services: More than 4 times per year    Active Member of [employer] or Organizations: Yes    Attends Engineer, structural: More than 4 times per year    Marital Status: Married    No outpatient medications prior to visit.   No facility-administered medications prior to visit.     EXAM:  BP 110/80 (BP Location: Left Arm, Patient Position: Sitting, Cuff Size: Normal)   Pulse 78   Temp 97.8 F (36.6 C) (Oral)   Ht 5' 4.75" (1.645 m)   Wt 162 lb 3.2 oz (73.6 kg)   LMP 08/01/2012   SpO2 98%   BMI 27.20 kg/m   Body mass index is 27.2 kg/m. Wt Readings from Last 3 Encounters:  10/17/23 162 lb 3.2 oz (73.6 kg)  10/12/22 160 lb 9.6 oz (72.8 kg)  05/31/22 161 lb 12.8 oz (73.4 kg)    Physical Exam: Vital signs reviewed [id number] is a well-developed well-nourished alert cooperative    who appearsr stated age in no acute distress.  HEENT: normocephalic  atraumatic , Eyes: PERRL EOM's full, conjunctiva clear, Nares: paten,t no deformity discharge or tenderness., Ears: no deformity EAC's clear TMs with normal landmarks. Mouth: clear OP, no lesions, edema.  Moist mucous membranes. Dentition in adequate repair. NECK: supple without masses, thyromegaly or bruits. CHEST/PULM:  Clear to auscultation and percussion breath sounds equal no wheeze , rales or rhonchi. No chest wall deformities or  tenderness. Breast: normal by inspection . No dimpling, discharge, masses, tenderness or discharge . CV: PMI is nondisplaced, S1 S2split  no gallops, 2-3/6 syts murmurs left and upper chest   unclear if P2 more than P1 ocass premature  beat.  Otherwise  rr , rubs. Peripheral pulses are full without delay.No JVD .  ABDOMEN: Bowel sounds normal nontender  No guard or rebound, no hepato splenomegal no CVA tenderness.  No fluid  . Extremtities:  No clubbing cyanosis or edema, no acute joint swelling or redness no focal atrophy NEURO:  Oriented x3, cranial nerves 3-12 appear to be intact, no obvious focal weakness,gait within normal limits no abnormal reflexes or asymmetrical SKIN: No acute rashes normal turgor, color, no bruising or petechiae. PSYCH: Oriented, good eye contact, no obvious depression anxiety, cognition and judgment appear normal. LN: no cervical axillary inguinal adenopathy  Lab Results  Component Value Date   WBC 6.2 10/03/2023   HGB 14.0 10/03/2023   HCT 44.4 10/03/2023   PLT 210 10/03/2023   GLUCOSE 91 10/03/2023   CHOL 206 (H) 10/03/2023   TRIG 88 10/03/2023   HDL 51 10/03/2023   LDLCALC 139 (H) 10/03/2023   ALT 16 10/03/2023   AST 22 10/03/2023   NA 143 10/03/2023   K 4.5 10/03/2023   CL 107 (H) 10/03/2023   CREATININE 0.82 10/03/2023   BUN 14 10/03/2023   CO2 24 10/03/2023   TSH 1.770 10/03/2023   INR 1.1 11/11/2008   HGBA1C 5.7 (H) 10/03/2023    BP Readings from Last 3 Encounters:  10/17/23 110/80  10/12/22 104/84  05/31/22  96/70    Lab results reviewed with patient  Last vitamin D Lab Results  Component Value Date   VD25OH 6.1 (L) 10/03/2023     ASSESSMENT AND PLAN:  Discussed the following assessment and plan:    ICD-10-CM   1. Visit for preventive health examination  Z00.00     2. Vitamin D deficiency  E55.9     3. Breast cancer screening by mammogram  Z12.31 MM 3D SCREENING MAMMOGRAM BILATERAL BREAST    4. Colon cancer screening  Z12.11 Cologuard    5. Atrial septal defect  Q21.10     Can do pap next year  last pap was   neg and hpv neg in 2022 and now no sx or other high risk  Asd: has declined surgery throughout the years  currently no sx . M slightly less but no  clinical evidence of  pulm HT Maybe echo next year , she prefers without consult if possible for cost)  Disc vit d def  very low  will not take meds usually but will look into it  and improved diet and get outside ( likes to stay inside mostly)   has read up on dietary implementation.  And likes salmon etc  but needs to get outside  [facility] and send to [place]  Return for vit d level in 3 -4 months  Hw order given.  Patient Care Team: Madelin Headings, MD as PCP - General Patient Instructions  Continue healthy eating. Try adding high dose vit d3 for 8 weeks once a week and then dietary  d and go outside.   Plan recheck vit d level   in 3-4 months or as  indicated .   Neta Mends. Lyfe Reihl M.D.

## 2023-10-17 NOTE — Patient Instructions (Signed)
 Continue healthy eating. Try adding high dose vit d3 for 8 weeks once a week and then dietary  d and go outside.   Plan recheck vit d level   in 3-4 months or as  indicated .

## 2023-11-04 LAB — COLOGUARD

## 2023-11-20 ENCOUNTER — Ambulatory Visit (HOSPITAL_BASED_OUTPATIENT_CLINIC_OR_DEPARTMENT_OTHER)
Admission: RE | Admit: 2023-11-20 | Discharge: 2023-11-20 | Disposition: A | Discharge status: Home / Self Care | Source: Ambulatory Visit | Attending: Internal Medicine | Admitting: Internal Medicine

## 2023-11-20 ENCOUNTER — Encounter (HOSPITAL_BASED_OUTPATIENT_CLINIC_OR_DEPARTMENT_OTHER): Payer: Self-pay | Admitting: Radiology

## 2023-11-20 DIAGNOSIS — Z1231 Encounter for screening mammogram for malignant neoplasm of breast: Secondary | ICD-10-CM | POA: Diagnosis present

## 2023-11-25 LAB — COLOGUARD: COLOGUARD: NEGATIVE

## 2024-01-24 IMAGING — MG MM DIGITAL SCREENING BILAT W/ TOMO AND CAD
8 series · 9 of 24 positions shown · non-contrast
Comparison: None.

CLINICAL DATA: Screening.

EXAM:
DIGITAL SCREENING BILATERAL MAMMOGRAM WITH TOMOSYNTHESIS AND CAD
TECHNIQUE: Bilateral screening digital craniocaudal and mediolateral oblique
mammograms were obtained. Bilateral screening digital breast
tomosynthesis was performed. The images were evaluated with
computer-aided detection.

[R CC synth-2D]
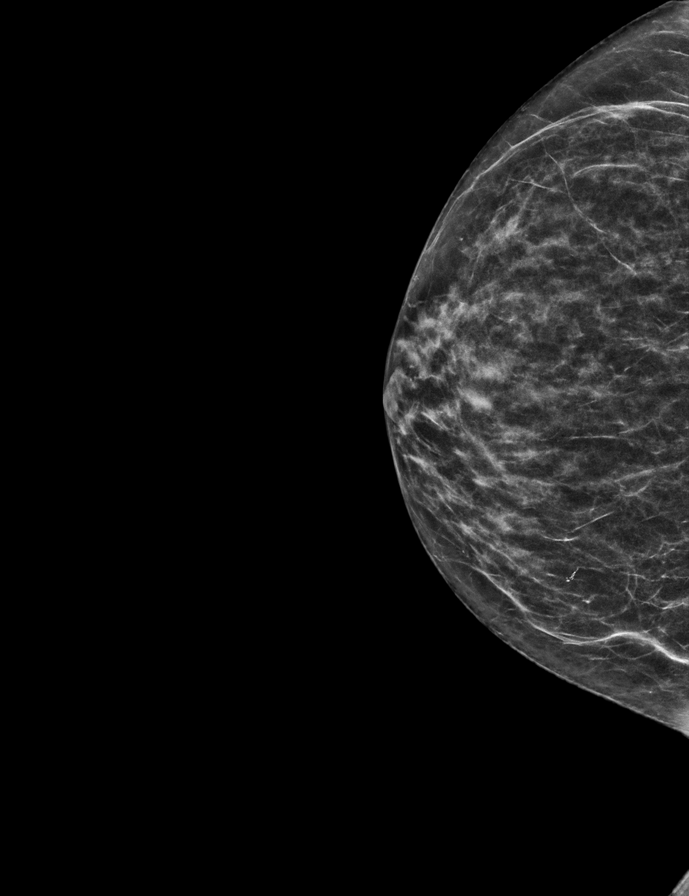

[L CC synth-2D]
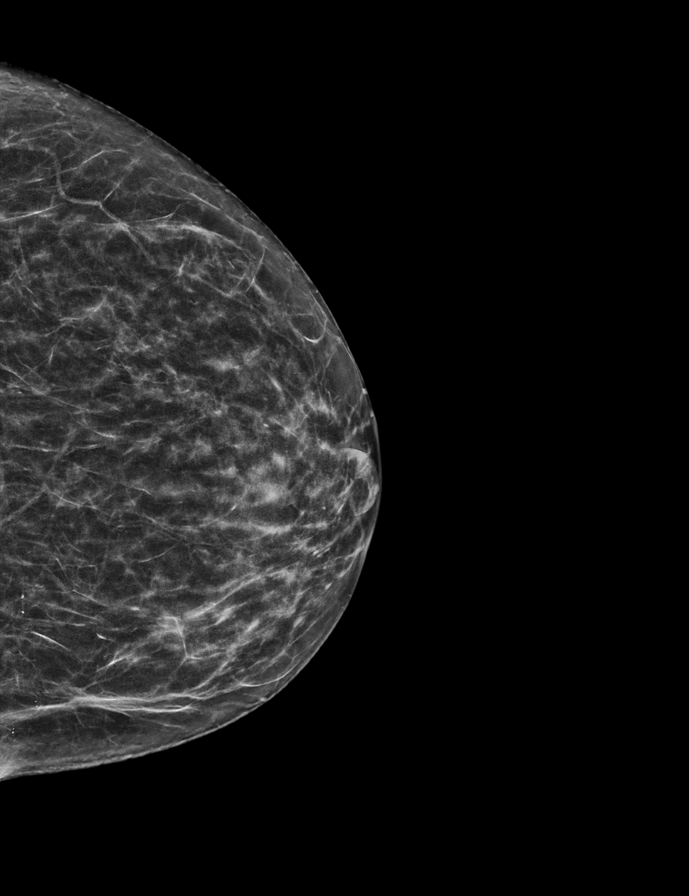

[R MLO synth-2D]
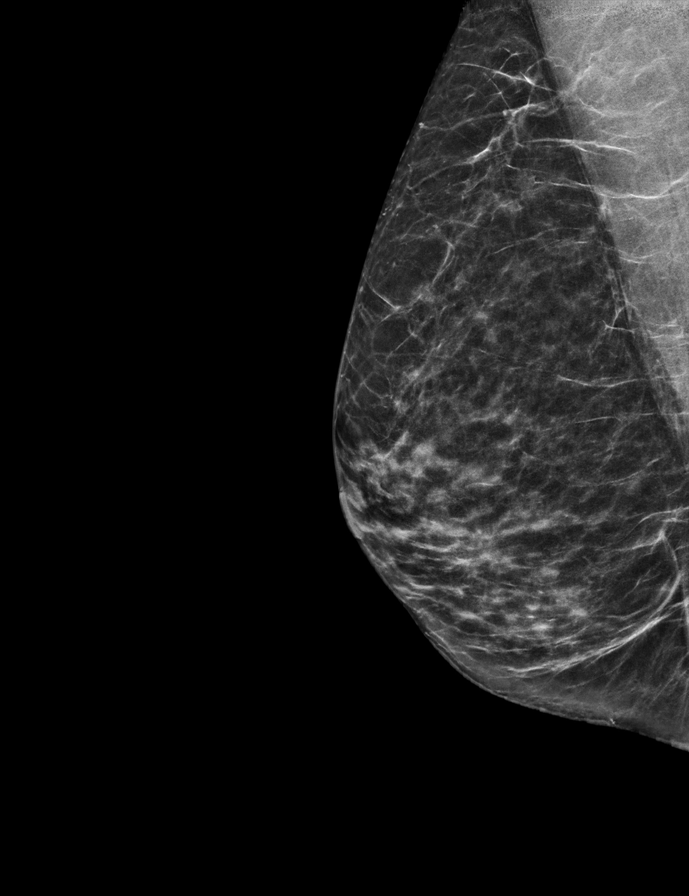

[L MLO synth-2D]
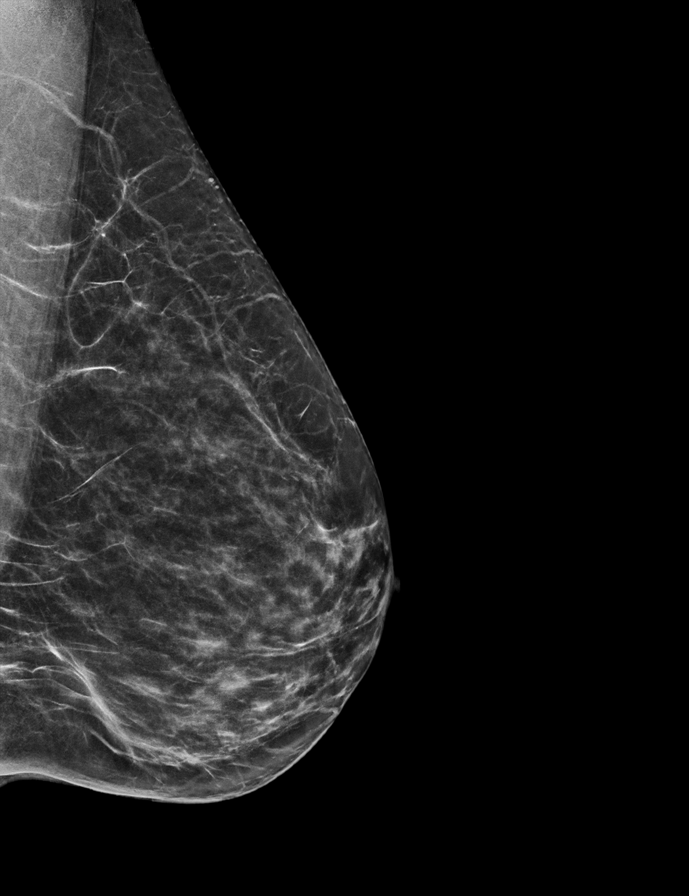

[R MLO tomo · 2 of 60 frames shown]
[frame 20/60]
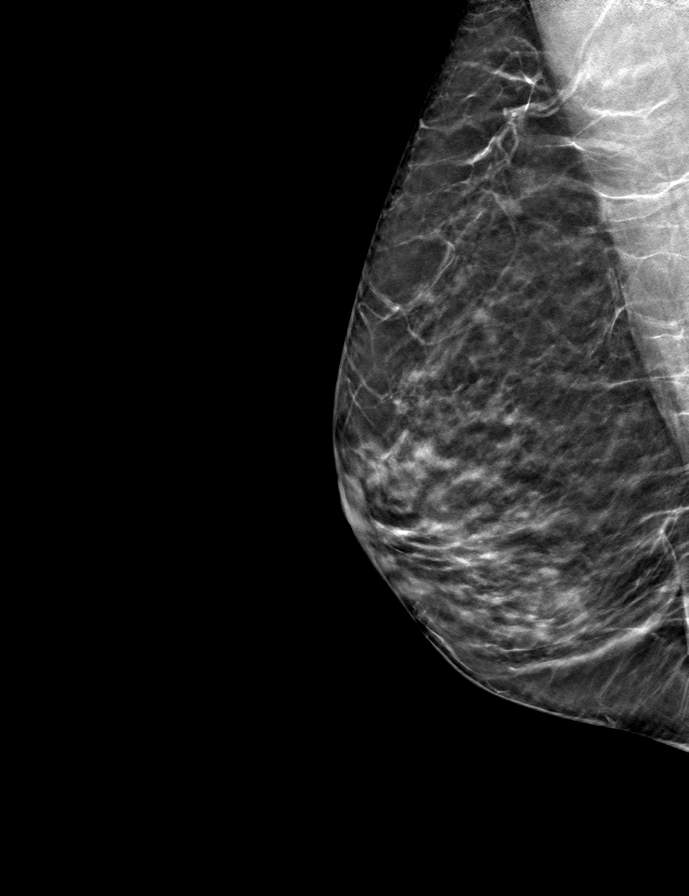
[frame 31/60]
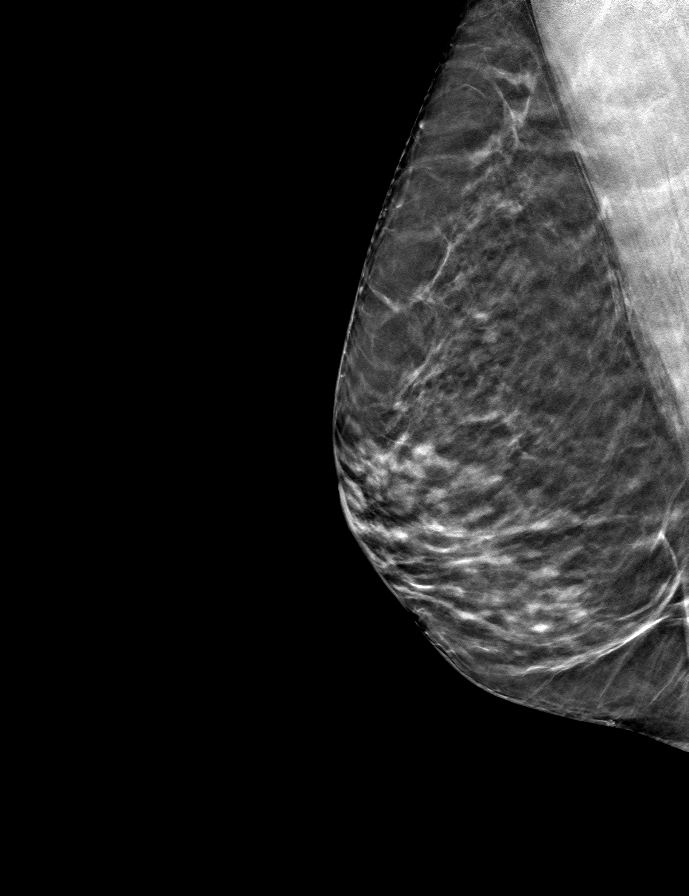

[L CC tomo · tomo slice 25/49.0]
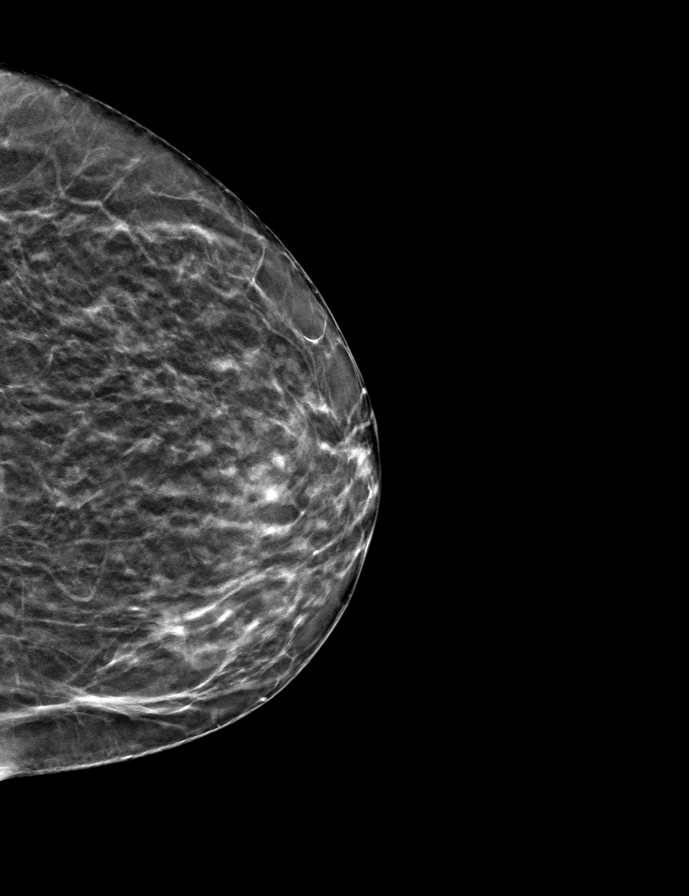

[R CC tomo · tomo slice 26/51.0]
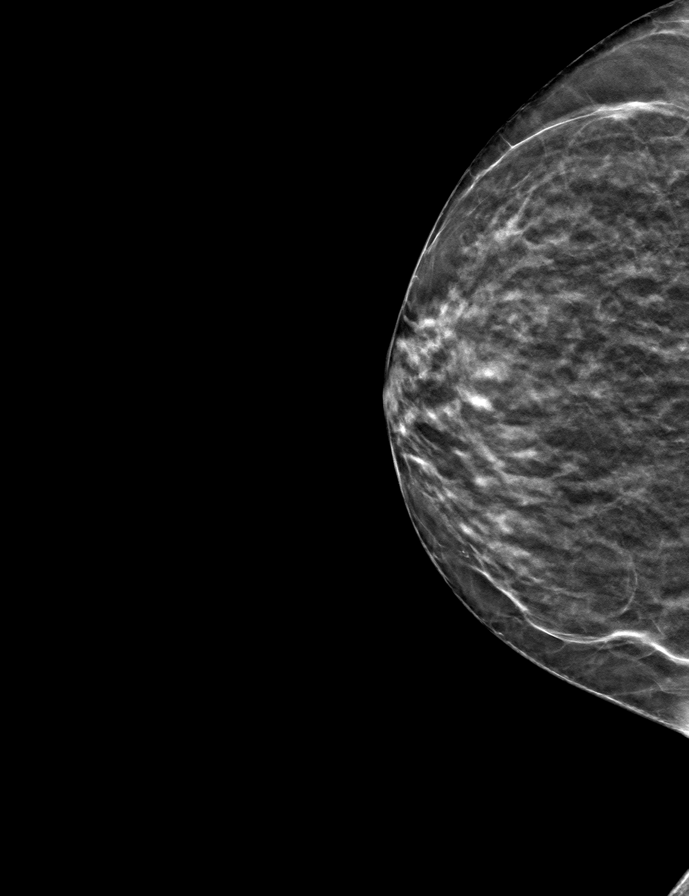

[L MLO tomo · tomo slice 29/56.0]
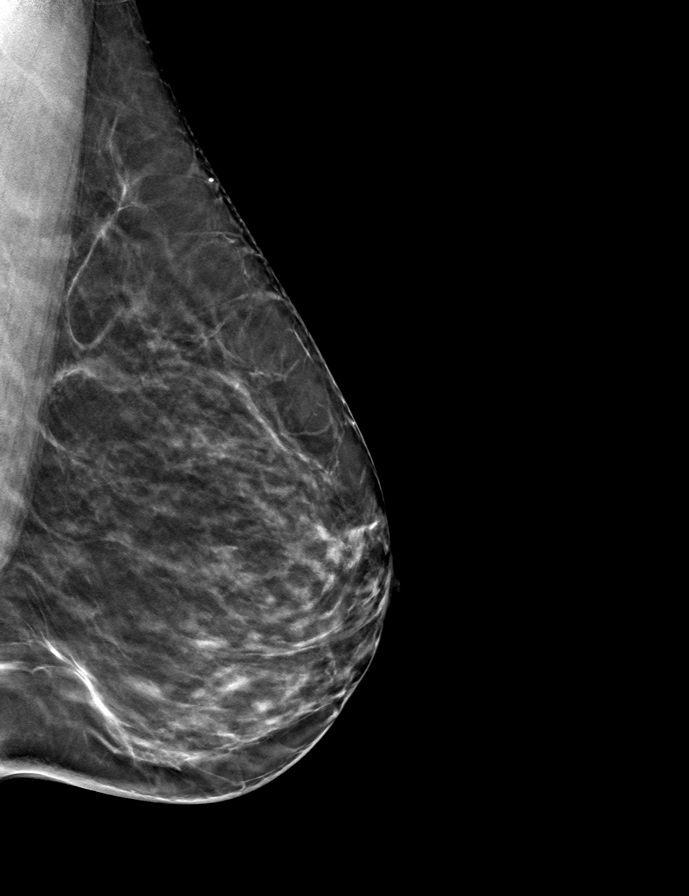

[9 of 24 positions shown; findings below may reference images not displayed]

ACR Breast Density Category c: The breast tissue is heterogeneously
dense, which may obscure small masses
FINDINGS: There are no findings suspicious for malignancy.
IMPRESSION: No mammographic evidence of malignancy. A result letter of this
screening mammogram will be mailed directly to the patient.

RECOMMENDATION:
Screening mammogram in one year. (Code:C8-T-HNK)

BI-RADS CATEGORY  1: Negative.

## 2024-10-17 ENCOUNTER — Encounter: Admitting: Internal Medicine
